# Patient Record
Sex: Male | Born: 1950 | Race: Black or African American | Hispanic: No | Marital: Single | Smoking: Current every day smoker
Health system: Southern US, Community
[De-identification: ages and names within clinical notes are randomized; demographics above are authoritative.]

## PROBLEM LIST (undated history)

## (undated) DIAGNOSIS — M549 Dorsalgia, unspecified: Secondary | ICD-10-CM

## (undated) DIAGNOSIS — J449 Chronic obstructive pulmonary disease, unspecified: Secondary | ICD-10-CM

## (undated) HISTORY — PX: EYE SURGERY: SHX253

## (undated) HISTORY — PX: OTHER SURGICAL HISTORY: SHX169

---

## 2014-12-27 ENCOUNTER — Emergency Department (HOSPITAL_COMMUNITY)
Admission: EM | Admit: 2014-12-27 | Discharge: 2014-12-27 | Disposition: A | Discharge status: Home / Self Care | Payer: Self-pay | Attending: Emergency Medicine | Admitting: Emergency Medicine

## 2014-12-27 ENCOUNTER — Encounter (HOSPITAL_COMMUNITY): Payer: Self-pay | Admitting: Emergency Medicine

## 2014-12-27 DIAGNOSIS — F17210 Nicotine dependence, cigarettes, uncomplicated: Secondary | ICD-10-CM | POA: Insufficient documentation

## 2014-12-27 DIAGNOSIS — Y999 Unspecified external cause status: Secondary | ICD-10-CM | POA: Insufficient documentation

## 2014-12-27 DIAGNOSIS — X58XXXA Exposure to other specified factors, initial encounter: Secondary | ICD-10-CM | POA: Insufficient documentation

## 2014-12-27 DIAGNOSIS — S39012A Strain of muscle, fascia and tendon of lower back, initial encounter: Secondary | ICD-10-CM | POA: Insufficient documentation

## 2014-12-27 DIAGNOSIS — Y939 Activity, unspecified: Secondary | ICD-10-CM | POA: Insufficient documentation

## 2014-12-27 DIAGNOSIS — G8929 Other chronic pain: Secondary | ICD-10-CM | POA: Insufficient documentation

## 2014-12-27 DIAGNOSIS — Z880 Allergy status to penicillin: Secondary | ICD-10-CM | POA: Insufficient documentation

## 2014-12-27 DIAGNOSIS — Y929 Unspecified place or not applicable: Secondary | ICD-10-CM | POA: Insufficient documentation

## 2014-12-27 HISTORY — DX: Dorsalgia, unspecified: M54.9

## 2014-12-27 LAB — URINALYSIS, ROUTINE W REFLEX MICROSCOPIC
Glucose, UA: NEGATIVE mg/dL
Hgb urine dipstick: NEGATIVE
Ketones, ur: 40 mg/dL — AB
Leukocytes, UA: NEGATIVE
Nitrite: NEGATIVE
Protein, ur: 30 mg/dL — AB
Specific Gravity, Urine: >1.03 — ABNORMAL HIGH (ref 1.005–1.030)
pH: 6 (ref 5.0–8.0)

## 2014-12-27 LAB — URINE MICROSCOPIC-ADD ON

## 2014-12-27 MED ORDER — NAPROXEN 500 MG PO TABS: 500.0000 mg | ORAL_TABLET | Freq: Two times a day (BID) | ORAL | Status: DC

## 2014-12-27 MED ORDER — METHOCARBAMOL 500 MG PO TABS: 500.0000 mg | ORAL_TABLET | Freq: Three times a day (TID) | ORAL | Status: DC

## 2014-12-27 NOTE — ED Notes (Signed)
Discharge papers reviewed with pt- Discussed use of muscle relaxer and  Pain meds-- also concerning not using other anti-inflammatory pain meds with the prescription med. Pt verbalized understanding .

## 2014-12-27 NOTE — Discharge Instructions (Signed)

## 2014-12-27 NOTE — ED Notes (Signed)
PT c/o lower back pain worsening x1 day with no reported injury. PT c/o hx of chronic lower back pain. PT ambulatory in triage with NAD noted.

## 2014-12-29 LAB — URINE CULTURE: Culture: 2000
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
## 2017-07-28 DIAGNOSIS — R221 Localized swelling, mass and lump, neck: Secondary | ICD-10-CM | POA: Diagnosis not present

## 2017-08-25 NOTE — Congregational Nurse Program (Signed)
Congregational Nurse Program Note  Date of Encounter: 08/25/2017  Past Medical History: Past Medical History:  Diagnosis Date  . Back pain     Encounter Details: Seen at the food [facility]. No complaints or concerns B P 104/70  [address] Jaeleen Inzunza [place], [place] [facility]  Program [phone]

## 2019-03-19 ENCOUNTER — Ambulatory Visit: Payer: Medicare Other

## 2019-03-27 DIAGNOSIS — R0602 Shortness of breath: Secondary | ICD-10-CM | POA: Diagnosis not present

## 2019-03-27 DIAGNOSIS — J309 Allergic rhinitis, unspecified: Secondary | ICD-10-CM | POA: Diagnosis not present

## 2019-03-27 DIAGNOSIS — Z1159 Encounter for screening for other viral diseases: Secondary | ICD-10-CM | POA: Diagnosis not present

## 2019-05-08 DIAGNOSIS — J449 Chronic obstructive pulmonary disease, unspecified: Secondary | ICD-10-CM | POA: Diagnosis not present

## 2019-05-08 DIAGNOSIS — Z0000 Encounter for general adult medical examination without abnormal findings: Secondary | ICD-10-CM | POA: Diagnosis not present

## 2019-05-08 DIAGNOSIS — R221 Localized swelling, mass and lump, neck: Secondary | ICD-10-CM | POA: Diagnosis not present

## 2019-05-29 ENCOUNTER — Encounter: Payer: Self-pay | Admitting: *Deleted

## 2019-06-24 ENCOUNTER — Emergency Department (HOSPITAL_COMMUNITY)
Admission: EM | Admit: 2019-06-24 | Discharge: 2019-06-24 | Disposition: A | Discharge status: Home / Self Care | Payer: Medicare Other | Attending: Emergency Medicine | Admitting: Emergency Medicine

## 2019-06-24 ENCOUNTER — Other Ambulatory Visit: Payer: Self-pay

## 2019-06-24 ENCOUNTER — Emergency Department (HOSPITAL_COMMUNITY): Payer: Medicare Other

## 2019-06-24 ENCOUNTER — Encounter (HOSPITAL_COMMUNITY): Payer: Self-pay | Admitting: Emergency Medicine

## 2019-06-24 DIAGNOSIS — R0602 Shortness of breath: Secondary | ICD-10-CM | POA: Diagnosis present

## 2019-06-24 DIAGNOSIS — J441 Chronic obstructive pulmonary disease with (acute) exacerbation: Secondary | ICD-10-CM | POA: Insufficient documentation

## 2019-06-24 DIAGNOSIS — Z7951 Long term (current) use of inhaled steroids: Secondary | ICD-10-CM | POA: Diagnosis not present

## 2019-06-24 DIAGNOSIS — Z79899 Other long term (current) drug therapy: Secondary | ICD-10-CM | POA: Diagnosis not present

## 2019-06-24 DIAGNOSIS — F17210 Nicotine dependence, cigarettes, uncomplicated: Secondary | ICD-10-CM | POA: Diagnosis not present

## 2019-06-24 LAB — COMPREHENSIVE METABOLIC PANEL
ALT: 15 U/L (ref 0–44)
AST: 17 U/L (ref 15–41)
Albumin: 3.7 g/dL (ref 3.5–5.0)
Alkaline Phosphatase: 73 U/L (ref 38–126)
Anion gap: 7 (ref 5–15)
BUN: 14 mg/dL (ref 8–23)
CO2: 28 mmol/L (ref 22–32)
Calcium: 8.9 mg/dL (ref 8.9–10.3)
Chloride: 107 mmol/L (ref 98–111)
Creatinine, Ser: 1.17 mg/dL (ref 0.61–1.24)
GFR calc Af Amer: >60 mL/min (ref >60)
GFR calc non Af Amer: >60 mL/min (ref >60)
Glucose, Bld: 82 mg/dL (ref 70–99)
Potassium: 3.8 mmol/L (ref 3.5–5.1)
Sodium: 142 mmol/L (ref 135–145)
Total Bilirubin: 0.5 mg/dL (ref 0.3–1.2)
Total Protein: 6.6 g/dL (ref 6.5–8.1)

## 2019-06-24 LAB — CBC
HCT: 39.6 % (ref 39.0–52.0)
Hemoglobin: 12.6 g/dL — ABNORMAL LOW (ref 13.0–17.0)
MCH: 26.7 pg (ref 26.0–34.0)
MCHC: 31.8 g/dL (ref 30.0–36.0)
MCV: 83.9 fL (ref 80.0–100.0)
Platelets: 119 10*3/uL — ABNORMAL LOW (ref 150–400)
RBC: 4.72 MIL/uL (ref 4.22–5.81)
RDW: 15.3 % (ref 11.5–15.5)
WBC: 4 10*3/uL (ref 4.0–10.5)
nRBC: 0 % (ref 0.0–0.2)

## 2019-06-24 MED ORDER — PREDNISONE 10 MG PO TABS: 40.0000 mg | ORAL_TABLET | Freq: Every day | ORAL | 0 refills | Status: DC

## 2019-06-24 MED ORDER — PREDNISONE 50 MG PO TABS
60.0000 mg | ORAL_TABLET | Freq: Once | ORAL | Status: AC
  Administered 2019-06-24: 60 mg via ORAL
  Filled 2019-06-24: qty 1

## 2019-06-24 MED ORDER — ALBUTEROL SULFATE HFA 108 (90 BASE) MCG/ACT IN AERS
2.0000 | INHALATION_SPRAY | Freq: Four times a day (QID) | RESPIRATORY_TRACT | Status: DC
  Administered 2019-06-24: 2 via RESPIRATORY_TRACT
  Filled 2019-06-24: qty 6.7

## 2019-06-24 NOTE — ED Triage Notes (Signed)
Pt c/o of increased SOb. Used inhalers at home but no relief. HX of COPD

## 2019-06-24 NOTE — Discharge Instructions (Signed)
Use albuterol inhaler 2 puffs every 6 hours.  Take the prednisone as directed for the next 5 days.  Return for any new or worse symptoms.  Keep your appointment for evaluation of the lump in the neck.
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
## 2019-06-24 NOTE — ED Notes (Signed)
Pt refused vitals at time of discharge

## 2019-07-04 ENCOUNTER — Other Ambulatory Visit: Payer: Self-pay | Admitting: Internal Medicine

## 2019-07-04 ENCOUNTER — Other Ambulatory Visit (HOSPITAL_COMMUNITY): Payer: Self-pay | Admitting: Internal Medicine

## 2019-07-04 DIAGNOSIS — R221 Localized swelling, mass and lump, neck: Secondary | ICD-10-CM

## 2019-08-06 ENCOUNTER — Other Ambulatory Visit: Payer: Self-pay

## 2019-08-06 ENCOUNTER — Other Ambulatory Visit (HOSPITAL_COMMUNITY): Payer: Self-pay | Admitting: Family Medicine

## 2019-08-06 ENCOUNTER — Ambulatory Visit (HOSPITAL_COMMUNITY)
Admission: RE | Admit: 2019-08-06 | Discharge: 2019-08-06 | Disposition: A | Discharge status: Home / Self Care | Payer: Medicare Other | Source: Ambulatory Visit | Attending: Family Medicine | Admitting: Family Medicine

## 2019-08-06 DIAGNOSIS — J441 Chronic obstructive pulmonary disease with (acute) exacerbation: Secondary | ICD-10-CM | POA: Diagnosis not present

## 2019-08-22 ENCOUNTER — Ambulatory Visit: Payer: Medicare Other
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
## 2019-08-23 ENCOUNTER — Ambulatory Visit: Payer: Medicare Other | Admitting: Urology

## 2020-03-26 ENCOUNTER — Emergency Department (HOSPITAL_COMMUNITY)
Admission: EM | Admit: 2020-03-26 | Discharge: 2020-03-26 | Disposition: A | Discharge status: Home / Self Care | Payer: Medicare Other | Attending: Emergency Medicine | Admitting: Emergency Medicine

## 2020-03-26 ENCOUNTER — Encounter (HOSPITAL_COMMUNITY): Payer: Self-pay | Admitting: *Deleted

## 2020-03-26 ENCOUNTER — Emergency Department (HOSPITAL_COMMUNITY): Payer: Medicare Other

## 2020-03-26 ENCOUNTER — Other Ambulatory Visit: Payer: Self-pay

## 2020-03-26 DIAGNOSIS — Z7951 Long term (current) use of inhaled steroids: Secondary | ICD-10-CM | POA: Insufficient documentation

## 2020-03-26 DIAGNOSIS — J441 Chronic obstructive pulmonary disease with (acute) exacerbation: Secondary | ICD-10-CM | POA: Diagnosis not present

## 2020-03-26 DIAGNOSIS — R0602 Shortness of breath: Secondary | ICD-10-CM | POA: Diagnosis present

## 2020-03-26 DIAGNOSIS — F17210 Nicotine dependence, cigarettes, uncomplicated: Secondary | ICD-10-CM | POA: Insufficient documentation

## 2020-03-26 HISTORY — DX: Chronic obstructive pulmonary disease, unspecified: J44.9

## 2020-03-26 LAB — CBC WITH DIFFERENTIAL/PLATELET
Abs Immature Granulocytes: 0 10*3/uL (ref 0.00–0.07)
Basophils Absolute: 0 10*3/uL (ref 0.0–0.1)
Basophils Relative: 1 %
Eosinophils Absolute: 0.5 10*3/uL (ref 0.0–0.5)
Eosinophils Relative: 12 %
HCT: 41.9 % (ref 39.0–52.0)
Hemoglobin: 13.3 g/dL (ref 13.0–17.0)
Immature Granulocytes: 0 %
Lymphocytes Relative: 18 %
Lymphs Abs: 0.8 10*3/uL (ref 0.7–4.0)
MCH: 27.1 pg (ref 26.0–34.0)
MCHC: 31.7 g/dL (ref 30.0–36.0)
MCV: 85.3 fL (ref 80.0–100.0)
Monocytes Absolute: 0.5 10*3/uL (ref 0.1–1.0)
Monocytes Relative: 12 %
Neutro Abs: 2.5 10*3/uL (ref 1.7–7.7)
Neutrophils Relative %: 57 %
Platelets: 124 10*3/uL — ABNORMAL LOW (ref 150–400)
RBC: 4.91 MIL/uL (ref 4.22–5.81)
RDW: 15.4 % (ref 11.5–15.5)
WBC: 4.3 10*3/uL (ref 4.0–10.5)
nRBC: 0 % (ref 0.0–0.2)

## 2020-03-26 LAB — BASIC METABOLIC PANEL
Anion gap: 9 (ref 5–15)
BUN: 14 mg/dL (ref 8–23)
CO2: 25 mmol/L (ref 22–32)
Calcium: 8.6 mg/dL — ABNORMAL LOW (ref 8.9–10.3)
Chloride: 105 mmol/L (ref 98–111)
Creatinine, Ser: 1.08 mg/dL (ref 0.61–1.24)
GFR, Estimated: >60 mL/min (ref >60)
Glucose, Bld: 76 mg/dL (ref 70–99)
Potassium: 3.9 mmol/L (ref 3.5–5.1)
Sodium: 139 mmol/L (ref 135–145)

## 2020-03-26 MED ORDER — METHYLPREDNISOLONE SODIUM SUCC 125 MG IJ SOLR
125.0000 mg | Freq: Once | INTRAMUSCULAR | Status: AC
  Administered 2020-03-26: 125 mg via INTRAVENOUS
  Filled 2020-03-26: qty 2

## 2020-03-26 MED ORDER — PREDNISONE 10 MG PO TABS: 40.0000 mg | ORAL_TABLET | Freq: Every day | ORAL | 0 refills | Status: DC

## 2020-03-26 MED ORDER — ALBUTEROL SULFATE HFA 108 (90 BASE) MCG/ACT IN AERS
2.0000 | INHALATION_SPRAY | Freq: Once | RESPIRATORY_TRACT | Status: AC
  Administered 2020-03-26: 2 via RESPIRATORY_TRACT
  Filled 2020-03-26: qty 6.7

## 2020-03-26 NOTE — ED Triage Notes (Signed)
Pt c/o SOB and productive cough worsening over the last week. Denies fever. Pt reports hx of COPD.

## 2020-03-26 NOTE — Discharge Instructions (Signed)
Use albuterol inhaler 2 puffs every 6 hours.  Take the prednisone as directed.  Return for any new or worse symptoms.
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
## 2021-03-17 ENCOUNTER — Other Ambulatory Visit: Payer: Self-pay | Admitting: Nurse Practitioner

## 2021-03-17 DIAGNOSIS — R221 Localized swelling, mass and lump, neck: Secondary | ICD-10-CM

## 2021-04-02 ENCOUNTER — Encounter (HOSPITAL_COMMUNITY): Payer: Self-pay

## 2021-04-02 ENCOUNTER — Ambulatory Visit (HOSPITAL_COMMUNITY): Admission: RE | Admit: 2021-04-02 | Payer: Medicare Other | Source: Ambulatory Visit

## 2021-05-28 ENCOUNTER — Other Ambulatory Visit: Payer: Self-pay | Admitting: Family Medicine

## 2021-05-28 DIAGNOSIS — R221 Localized swelling, mass and lump, neck: Secondary | ICD-10-CM

## 2021-05-31 ENCOUNTER — Other Ambulatory Visit: Payer: Medicaid Other

## 2022-03-21 ENCOUNTER — Other Ambulatory Visit: Payer: Self-pay

## 2022-03-21 ENCOUNTER — Emergency Department (HOSPITAL_COMMUNITY): Payer: Medicare HMO

## 2022-03-21 ENCOUNTER — Emergency Department (HOSPITAL_COMMUNITY)
Admission: EM | Admit: 2022-03-21 | Discharge: 2022-03-21 | Disposition: A | Discharge status: Home / Self Care | Payer: Medicare HMO | Attending: Emergency Medicine | Admitting: Emergency Medicine

## 2022-03-21 DIAGNOSIS — Z87891 Personal history of nicotine dependence: Secondary | ICD-10-CM | POA: Diagnosis not present

## 2022-03-21 DIAGNOSIS — R739 Hyperglycemia, unspecified: Secondary | ICD-10-CM | POA: Insufficient documentation

## 2022-03-21 DIAGNOSIS — R300 Dysuria: Secondary | ICD-10-CM | POA: Diagnosis present

## 2022-03-21 DIAGNOSIS — E876 Hypokalemia: Secondary | ICD-10-CM | POA: Insufficient documentation

## 2022-03-21 DIAGNOSIS — J449 Chronic obstructive pulmonary disease, unspecified: Secondary | ICD-10-CM | POA: Diagnosis not present

## 2022-03-21 DIAGNOSIS — N390 Urinary tract infection, site not specified: Secondary | ICD-10-CM | POA: Diagnosis not present

## 2022-03-21 DIAGNOSIS — Z7951 Long term (current) use of inhaled steroids: Secondary | ICD-10-CM | POA: Insufficient documentation

## 2022-03-21 DIAGNOSIS — Z1152 Encounter for screening for COVID-19: Secondary | ICD-10-CM | POA: Diagnosis not present

## 2022-03-21 DIAGNOSIS — R509 Fever, unspecified: Secondary | ICD-10-CM

## 2022-03-21 DIAGNOSIS — D696 Thrombocytopenia, unspecified: Secondary | ICD-10-CM | POA: Diagnosis not present

## 2022-03-21 DIAGNOSIS — R000 Tachycardia, unspecified: Secondary | ICD-10-CM | POA: Insufficient documentation

## 2022-03-21 DIAGNOSIS — E871 Hypo-osmolality and hyponatremia: Secondary | ICD-10-CM | POA: Diagnosis not present

## 2022-03-21 LAB — RESP PANEL BY RT-PCR (RSV, FLU A&B, COVID)  RVPGX2
Influenza A by PCR: NEGATIVE
Influenza B by PCR: NEGATIVE
Resp Syncytial Virus by PCR: NEGATIVE
SARS Coronavirus 2 by RT PCR: NEGATIVE

## 2022-03-21 LAB — CBC WITH DIFFERENTIAL/PLATELET
Abs Immature Granulocytes: 0.03 10*3/uL (ref 0.00–0.07)
Basophils Absolute: 0 10*3/uL (ref 0.0–0.1)
Basophils Relative: 0 %
Eosinophils Absolute: 0 10*3/uL (ref 0.0–0.5)
Eosinophils Relative: 0 %
HCT: 39.3 % (ref 39.0–52.0)
Hemoglobin: 12.7 g/dL — ABNORMAL LOW (ref 13.0–17.0)
Immature Granulocytes: 1 %
Lymphocytes Relative: 5 %
Lymphs Abs: 0.3 10*3/uL — ABNORMAL LOW (ref 0.7–4.0)
MCH: 26.7 pg (ref 26.0–34.0)
MCHC: 32.3 g/dL (ref 30.0–36.0)
MCV: 82.6 fL (ref 80.0–100.0)
Monocytes Absolute: 0.6 10*3/uL (ref 0.1–1.0)
Monocytes Relative: 10 %
Neutro Abs: 5.4 10*3/uL (ref 1.7–7.7)
Neutrophils Relative %: 84 %
Platelets: 107 10*3/uL — ABNORMAL LOW (ref 150–400)
RBC: 4.76 MIL/uL (ref 4.22–5.81)
RDW: 15.3 % (ref 11.5–15.5)
WBC: 6.4 10*3/uL (ref 4.0–10.5)
nRBC: 0 % (ref 0.0–0.2)

## 2022-03-21 LAB — COMPREHENSIVE METABOLIC PANEL
ALT: 25 U/L (ref 0–44)
AST: 36 U/L (ref 15–41)
Albumin: 3.2 g/dL — ABNORMAL LOW (ref 3.5–5.0)
Alkaline Phosphatase: 89 U/L (ref 38–126)
Anion gap: 7 (ref 5–15)
BUN: 19 mg/dL (ref 8–23)
CO2: 23 mmol/L (ref 22–32)
Calcium: 8.1 mg/dL — ABNORMAL LOW (ref 8.9–10.3)
Chloride: 103 mmol/L (ref 98–111)
Creatinine, Ser: 1.2 mg/dL (ref 0.61–1.24)
GFR, Estimated: >60 mL/min (ref >60)
Glucose, Bld: 116 mg/dL — ABNORMAL HIGH (ref 70–99)
Potassium: 3.3 mmol/L — ABNORMAL LOW (ref 3.5–5.1)
Sodium: 133 mmol/L — ABNORMAL LOW (ref 135–145)
Total Bilirubin: 0.5 mg/dL (ref 0.3–1.2)
Total Protein: 7.2 g/dL (ref 6.5–8.1)

## 2022-03-21 LAB — URINALYSIS, ROUTINE W REFLEX MICROSCOPIC
Bilirubin Urine: NEGATIVE
Glucose, UA: NEGATIVE mg/dL
Ketones, ur: 20 mg/dL — AB
Nitrite: NEGATIVE
Protein, ur: 100 mg/dL — AB
Specific Gravity, Urine: 1.027 (ref 1.005–1.030)
WBC, UA: >50 WBC/hpf (ref 0–5)
pH: 5 (ref 5.0–8.0)

## 2022-03-21 LAB — LACTIC ACID, PLASMA: Lactic Acid, Venous: 1.1 mmol/L (ref 0.5–1.9)

## 2022-03-21 LAB — PROTIME-INR
INR: 1 (ref 0.8–1.2)
Prothrombin Time: 13.4 seconds (ref 11.4–15.2)

## 2022-03-21 MED ORDER — SODIUM CHLORIDE 0.9 % IV BOLUS
1000.0000 mL | Freq: Once | INTRAVENOUS | Status: AC
  Administered 2022-03-21: 1000 mL via INTRAVENOUS

## 2022-03-21 MED ORDER — IBUPROFEN 400 MG PO TABS
600.0000 mg | ORAL_TABLET | Freq: Once | ORAL | Status: AC
  Administered 2022-03-21: 600 mg via ORAL
  Filled 2022-03-21: qty 2

## 2022-03-21 MED ORDER — ACETAMINOPHEN 325 MG PO TABS
650.0000 mg | ORAL_TABLET | Freq: Once | ORAL | Status: AC
  Administered 2022-03-21: 650 mg via ORAL
  Filled 2022-03-21: qty 2

## 2022-03-21 MED ORDER — CEPHALEXIN 500 MG PO CAPS: 500.0000 mg | ORAL_CAPSULE | Freq: Four times a day (QID) | ORAL | 0 refills | Status: DC

## 2022-03-21 MED ORDER — SODIUM CHLORIDE 0.9 % IV SOLN
1.0000 g | Freq: Once | INTRAVENOUS | Status: AC
  Administered 2022-03-21: 1 g via INTRAVENOUS
  Filled 2022-03-21: qty 10

## 2022-03-21 NOTE — Discharge Instructions (Signed)
You are seen in the emergency department for dizziness and urinary symptoms.  Your urine was positive for signs of infection.  Please use Tylenol and ibuprofen as needed for fever and pain.  Finish your antibiotics.  Follow-up with your primary care doctor.  Return to the emergency department if any worsening or concerning symptoms

## 2022-03-21 NOTE — ED Triage Notes (Signed)
Pt c/o dizziness and dysuria x3 days.

## 2022-03-21 NOTE — ED Provider Notes (Signed)
[facility] Provider Note   CSN: [id number] Arrival date & time: 03/21/22  1856     History {Add pertinent medical, surgical, social history, OB history to HPI:1} Chief Complaint  Patient presents with   Fever   Dysuria    Jonathan Atkinson is a 72 y.o. male.  He is a former smoker, history of COPD uses an inhaler.  He said for the last 2 or 3 days he is felt lightheaded especially when standing for very long.  He is also noticed some burning with urination and he feels like he is urinating less than normal.  He denies any sores or lesions, not sexually active.  He thinks he might have been running a fever although he does not check his temperature.  He denies any cough nausea vomiting diarrhea rashes headache sore throat.  He has tried nothing for his symptoms.   Dysuria Presenting symptoms: dysuria   Context: after urination   Relieved by:  None tried Worsened by:  Urination Ineffective treatments:  None tried Associated symptoms: fever and urinary hesitation   Associated symptoms: no abdominal pain, no genital lesions, no hematuria, no nausea and no vomiting   Risk factors: not currently sexually active        Home Medications Prior to Admission medications   Medication Sig Start Date End Date Taking? Authorizing Provider  albuterol (VENTOLIN HFA) 108 (90 Base) MCG/ACT inhaler Inhale 2 puffs into the lungs every 4 (four) hours as needed. 06/14/19   [provider]  fluticasone (FLONASE) 50 MCG/ACT nasal spray Place 1 spray into both nostrils daily. 05/24/19   [provider]  loratadine (CLARITIN) 10 MG tablet Take 10 mg by mouth daily. 05/24/19   [provider]  methocarbamol (ROBAXIN) 500 MG tablet Take 1 tablet (500 mg total) by mouth 3 (three) times daily. Patient not taking: Reported on 06/24/2019 12/27/14   Triplett, Tammy, PA-C  naproxen (NAPROSYN) 500 MG tablet Take 1 tablet (500 mg total) by mouth 2  (two) times daily with a meal. Patient not taking: Reported on 06/24/2019 12/27/14   Triplett, Tammy, PA-C  predniSONE (DELTASONE) 10 MG tablet Take 4 tablets (40 mg total) by mouth daily. 06/24/19   Fredia Sorrow, MD  predniSONE (DELTASONE) 10 MG tablet Take 4 tablets (40 mg total) by mouth daily. 03/26/20   Fredia Sorrow, MD  STIOLTO RESPIMAT 2.5-2.5 MCG/ACT AERS Inhale 1 puff into the lungs daily. 06/14/19   [provider]      Allergies    Penicillins    Review of Systems   Review of Systems  Constitutional:  Positive for fever.  HENT:  Negative for sore throat.   Respiratory:  Negative for shortness of breath.   Cardiovascular:  Negative for chest pain.  Gastrointestinal:  Negative for abdominal pain, nausea and vomiting.  Genitourinary:  Positive for dysuria and hesitancy. Negative for hematuria.  Musculoskeletal:  Negative for back pain.  Skin:  Negative for rash.  Neurological:  Positive for dizziness.    Physical Exam Updated Vital Signs BP 132/79   Pulse (!) 117   Temp (!) 103.1 F (39.5 C) (Oral)   Resp (!) 22   Wt 69.4 kg   SpO2 97%   BMI 21.34 kg/m  Physical Exam Vitals and nursing note reviewed.  Constitutional:      General: He is not in acute distress.    Appearance: Normal appearance. He is well-developed.  HENT:  Head: Normocephalic and atraumatic.  Eyes:     Conjunctiva/sclera: Conjunctivae normal.  Cardiovascular:     Rate and Rhythm: Regular rhythm. Tachycardia present.     Heart sounds: No murmur heard. Pulmonary:     Effort: Pulmonary effort is normal. No respiratory distress.     Breath sounds: Normal breath sounds.  Abdominal:     Palpations: Abdomen is soft.     Tenderness: There is no abdominal tenderness. There is no guarding or rebound.  Musculoskeletal:        General: No deformity.     Cervical back: Neck supple.     Right lower leg: No edema.     Left lower leg: No edema.  Skin:    General: Skin is warm and dry.      Capillary Refill: Capillary refill takes less than 2 seconds.  Neurological:     General: No focal deficit present.     Mental Status: He is alert.     ED Results / Procedures / Treatments   Labs (all labs ordered are listed, but only abnormal results are displayed) Labs Reviewed  CULTURE, BLOOD (ROUTINE X 2)  CULTURE, BLOOD (ROUTINE X 2)  RESP PANEL BY RT-PCR (RSV, FLU A&B, COVID)  [id number]  COMPREHENSIVE METABOLIC PANEL  LACTIC ACID, PLASMA  LACTIC ACID, PLASMA  CBC WITH DIFFERENTIAL/PLATELET  PROTIME-INR  URINALYSIS, ROUTINE W REFLEX MICROSCOPIC    EKG None  Radiology No results found.  Procedures Procedures  {Document cardiac monitor, telemetry assessment procedure when appropriate:1}  Medications Ordered in ED Medications  sodium chloride 0.9 % bolus 1,000 mL (has no administration in time range)  acetaminophen (TYLENOL) tablet 650 mg (has no administration in time range)    ED Course/ Medical Decision Making/ A&P Clinical Course as of 03/21/22 2001  Mon Mar 21, 2022  2000 Chest x-ray interpreted by me as no acute infiltrate.  Awaiting radiology reading. [MB]    Clinical Course User Index [MB] Hayden Rasmussen, MD   {   Click here for ABCD2, HEART and other calculatorsREFRESH Note before signing :1}                          Medical Decision Making Amount and/or Complexity of Data Reviewed Labs: ordered.  Risk OTC drugs.   This patient complains of ***; this involves an extensive number of treatment Options and is a complaint that carries with it a high risk of complications and morbidity. The differential includes ***  I ordered, reviewed and interpreted labs, which included *** I ordered medication *** and reviewed PMP when indicated. I ordered imaging studies which included *** and I independently    visualized and interpreted imaging which showed *** Additional history obtained from *** Previous records obtained and reviewed *** I consulted  *** and discussed lab and imaging findings and discussed disposition.  Cardiac monitoring reviewed, *** Social determinants considered, *** Critical Interventions: ***  After the interventions stated above, I reevaluated the patient and found *** Admission and further testing considered, ***   {Document critical care time when appropriate:1} {Document review of labs and clinical decision tools ie heart score, Chads2Vasc2 etc:1}  {Document your independent review of radiology images, and any outside records:1} {Document your discussion with family members, caretakers, and with consultants:1} {Document social determinants of health affecting pt's care:1} {Document your decision making why or why not admission, treatments were needed:1} Final Clinical Impression(s) / ED Diagnoses Final diagnoses:  None  Rx / DC Orders ED Discharge Orders     None

## 2022-03-21 NOTE — ED Notes (Signed)
ED Provider at bedside. 

## 2022-03-24 LAB — URINE CULTURE: Culture: 80000 — AB

## 2022-03-25 ENCOUNTER — Telehealth (HOSPITAL_BASED_OUTPATIENT_CLINIC_OR_DEPARTMENT_OTHER): Payer: Self-pay | Admitting: *Deleted

## 2022-03-25 NOTE — Telephone Encounter (Signed)
Post ED Visit - Positive Culture Follow-up  Culture report reviewed by antimicrobial stewardship pharmacist: [place] Team '[]'$  Elenor Quinones, Pharm.D. '[]'$  Heide Guile, Pharm.D., BCPS AQ-ID '[]'$  Parks Neptune, Pharm.D., BCPS '[]'$  Alycia Rossetti, Pharm.D., BCPS '[]'$  [place], Pharm.D., BCPS, AAHIVP '[]'$  Legrand Como, Pharm.D., BCPS, AAHIVP '[]'$  Salome Arnt, PharmD, BCPS '[]'$  Johnnette Gourd, PharmD, BCPS '[]'$  Hughes Better, PharmD, BCPS '[]'$  Leeroy Cha, PharmD '[]'$  Laqueta Linden, PharmD, BCPS '[]'$  Albertina Parr, PharmD  [place] Team '[]'$  Leodis Sias, PharmD '[]'$  Lindell Spar, PharmD '[]'$  Royetta Asal, PharmD '[]'$  Graylin Shiver, Rph '[]'$  Rema Fendt) Glennon Mac, PharmD '[]'$  Arlyn Dunning, PharmD '[]'$  Netta Cedars, PharmD '[]'$  Dia Sitter, PharmD '[]'$  Leone Haven, PharmD '[]'$  Gretta Arab, PharmD '[]'$  Theodis Shove, PharmD '[]'$  Peggyann Juba, PharmD '[]'$  Reuel Boom, PharmD   Positive urine culture Treated with Cephalexin, organism sensitive to the same and no further patient follow-up is required at this time.  Luisa Hart, PharmD  Harlon Flor Talley 03/25/2022, 11:15 AM

## 2022-03-26 LAB — CULTURE, BLOOD (ROUTINE X 2)
Culture: NO GROWTH
Culture: NO GROWTH

## 2022-08-07 IMAGING — DX DG CHEST 1V PORT
2 series · 2 of 2 positions shown · non-contrast
Comparison: 08/06/2019 chest radiograph.

CLINICAL DATA: Dyspnea and cough for 2 days

EXAM:
PORTABLE CHEST 1 VIEW

[chest ap (1 of 2)]
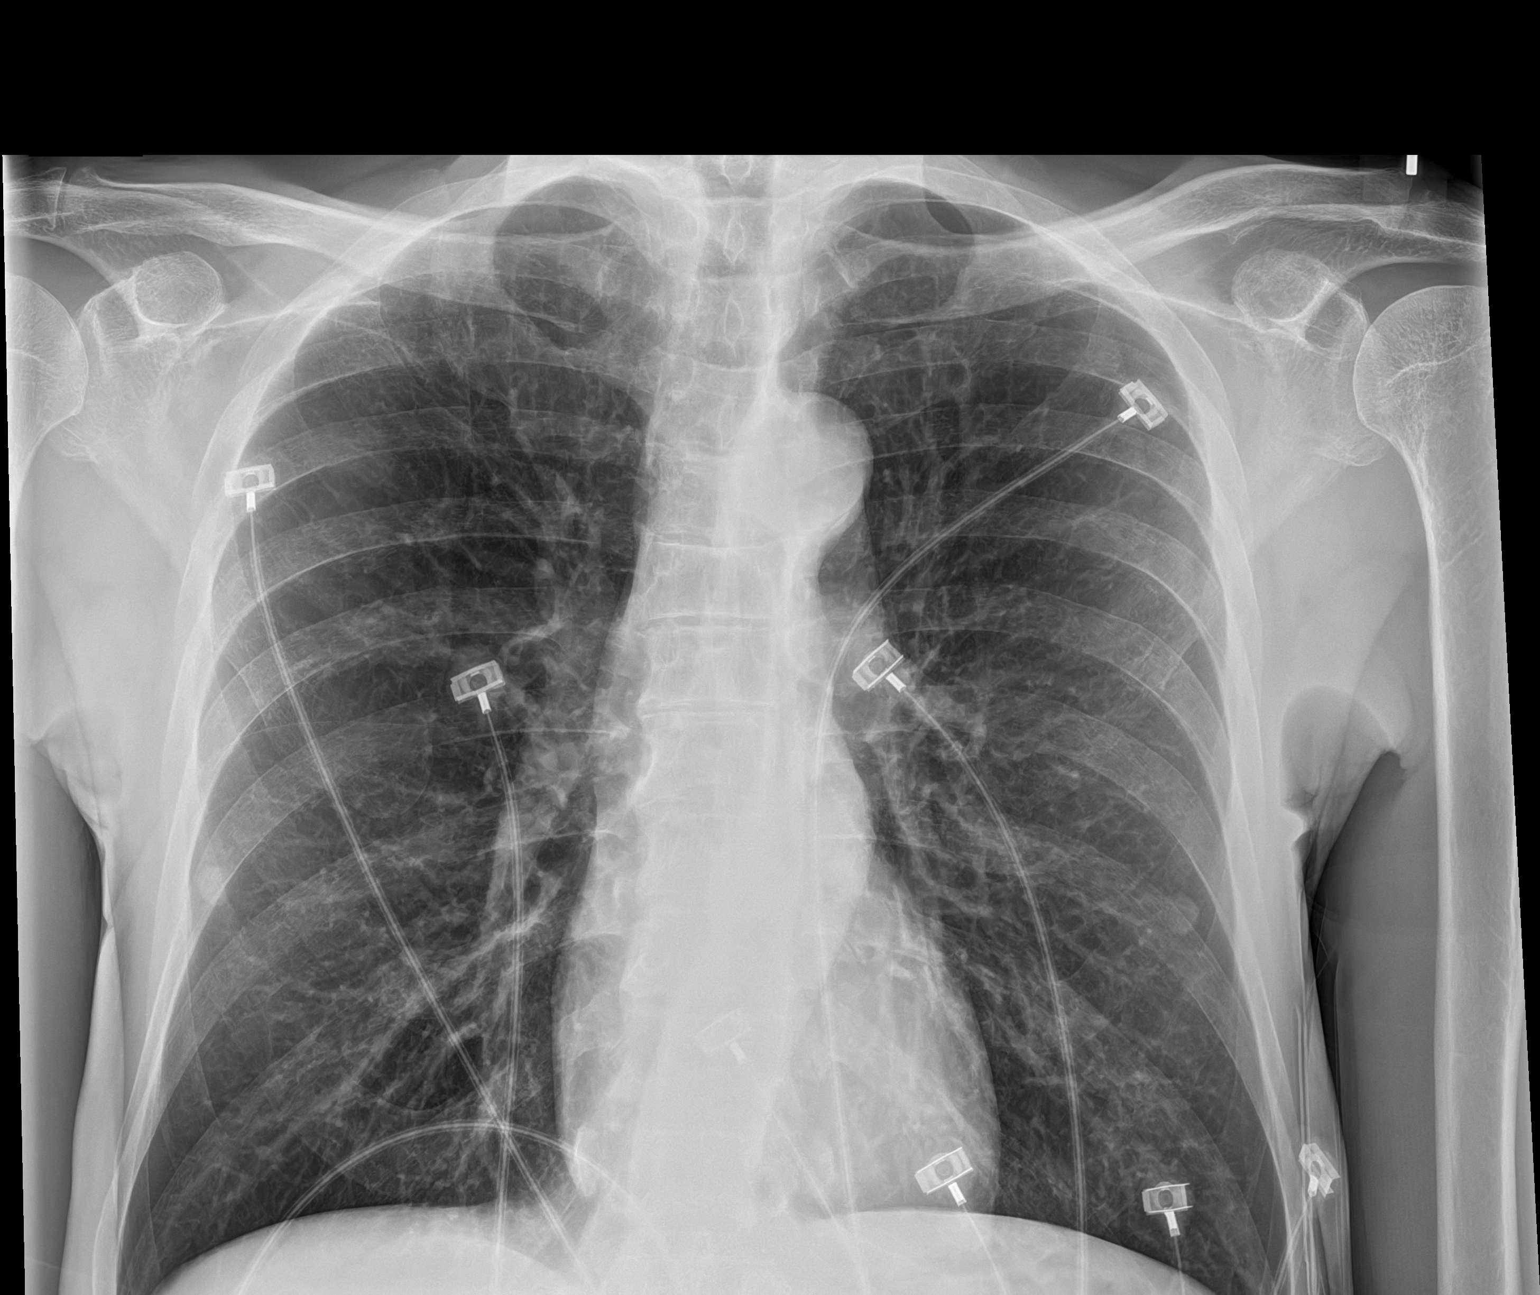

[chest ap (2 of 2)]
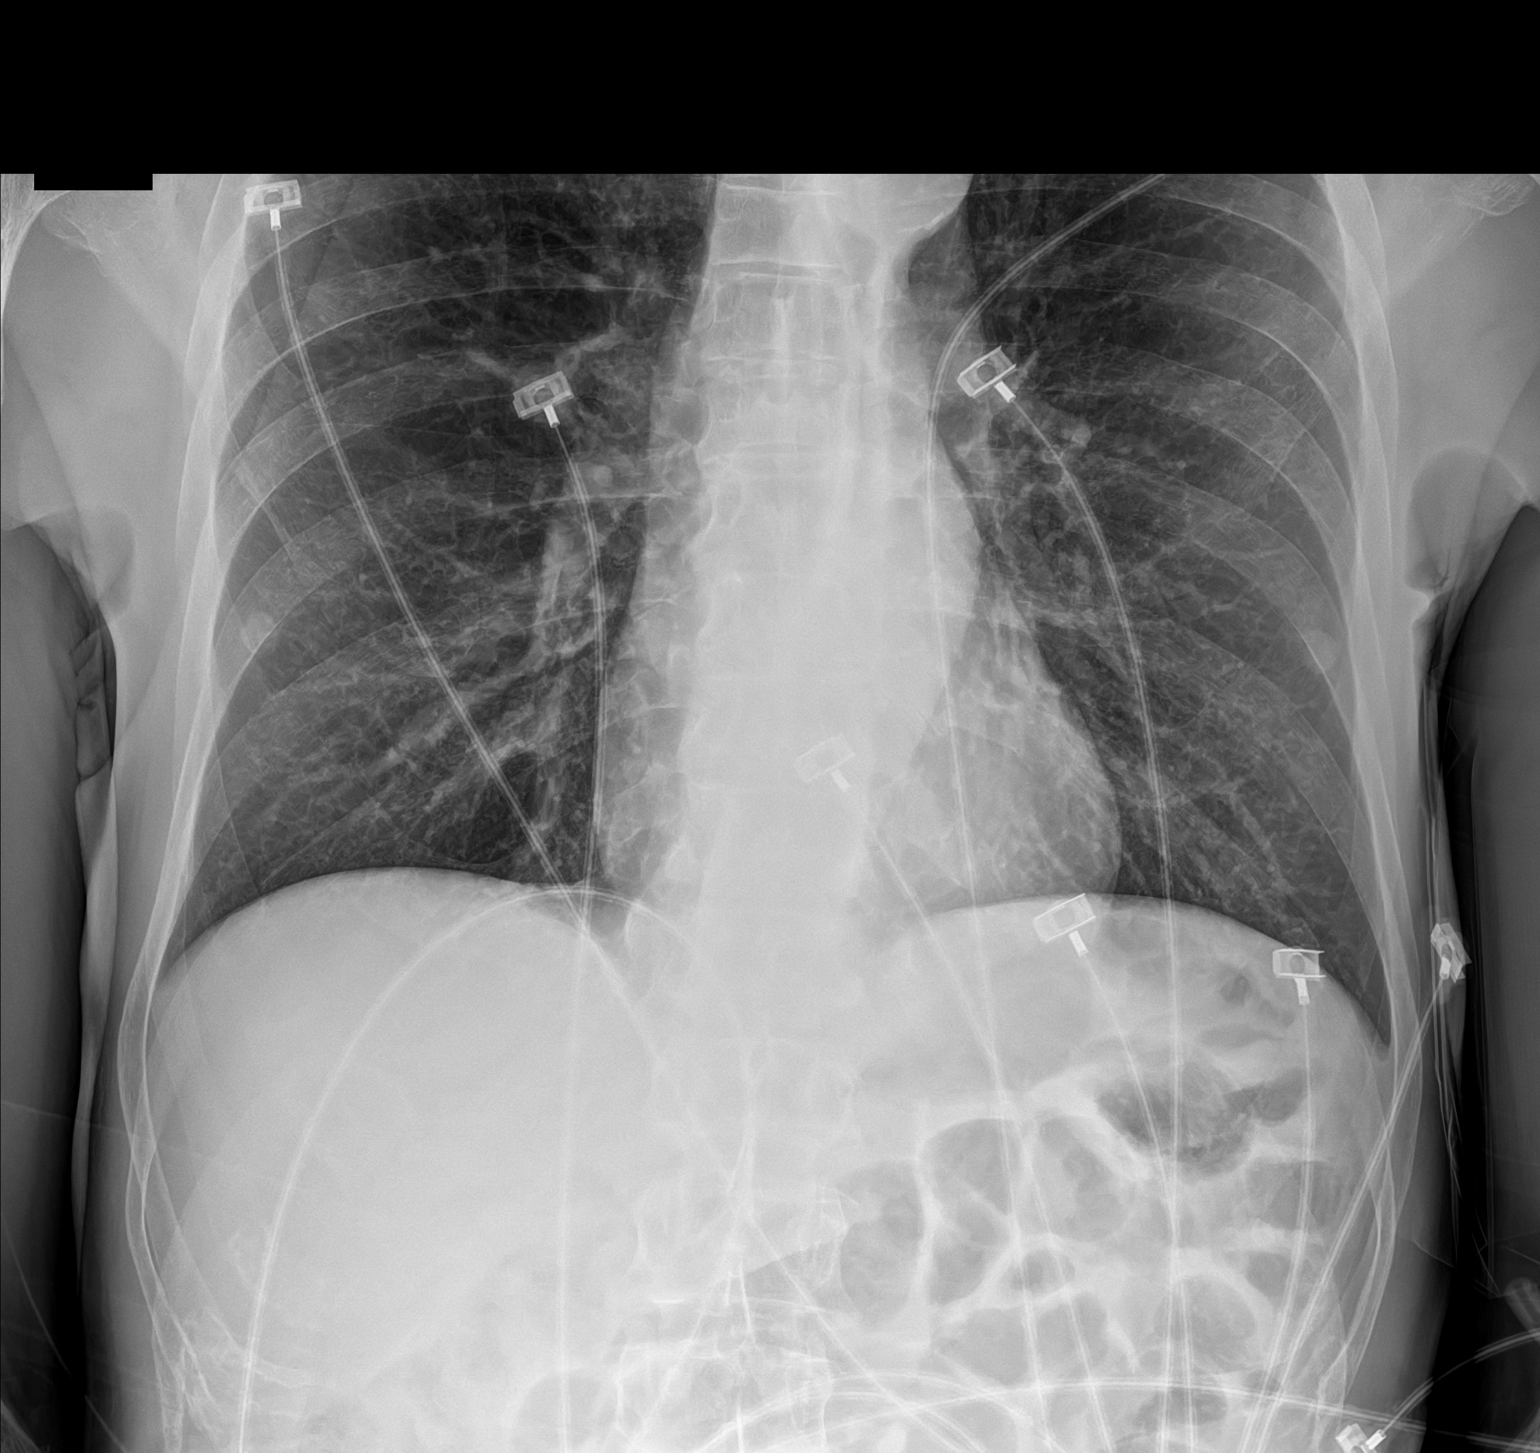

[2 of 2 positions shown; findings below may reference images not displayed]

FINDINGS: Stable cardiomediastinal silhouette with normal heart size. No
pneumothorax. No pleural effusion. Lungs appear clear, with no acute
consolidative airspace disease and no pulmonary edema.
IMPRESSION: No active disease.

## 2022-09-13 ENCOUNTER — Encounter: Payer: Self-pay | Admitting: *Deleted

## 2022-10-09 ENCOUNTER — Emergency Department (HOSPITAL_COMMUNITY)
Admission: EM | Admit: 2022-10-09 | Discharge: 2022-10-09 | Disposition: A | Discharge status: Home / Self Care | Payer: Medicare HMO | Attending: Emergency Medicine | Admitting: Emergency Medicine

## 2022-10-09 ENCOUNTER — Encounter (HOSPITAL_COMMUNITY): Payer: Self-pay

## 2022-10-09 ENCOUNTER — Emergency Department (HOSPITAL_COMMUNITY): Payer: Medicare HMO

## 2022-10-09 ENCOUNTER — Other Ambulatory Visit: Payer: Self-pay

## 2022-10-09 DIAGNOSIS — Z20822 Contact with and (suspected) exposure to covid-19: Secondary | ICD-10-CM | POA: Insufficient documentation

## 2022-10-09 DIAGNOSIS — J441 Chronic obstructive pulmonary disease with (acute) exacerbation: Secondary | ICD-10-CM | POA: Diagnosis not present

## 2022-10-09 DIAGNOSIS — Z7951 Long term (current) use of inhaled steroids: Secondary | ICD-10-CM | POA: Insufficient documentation

## 2022-10-09 DIAGNOSIS — R0602 Shortness of breath: Secondary | ICD-10-CM | POA: Diagnosis present

## 2022-10-09 LAB — CBC WITH DIFFERENTIAL/PLATELET
Abs Immature Granulocytes: 0.01 10*3/uL (ref 0.00–0.07)
Basophils Absolute: 0.1 10*3/uL (ref 0.0–0.1)
Basophils Relative: 1 %
Eosinophils Absolute: 0.6 10*3/uL — ABNORMAL HIGH (ref 0.0–0.5)
Eosinophils Relative: 14 %
HCT: 42.9 % (ref 39.0–52.0)
Hemoglobin: 13.1 g/dL (ref 13.0–17.0)
Immature Granulocytes: 0 %
Lymphocytes Relative: 25 %
Lymphs Abs: 1.1 10*3/uL (ref 0.7–4.0)
MCH: 26 pg (ref 26.0–34.0)
MCHC: 30.5 g/dL (ref 30.0–36.0)
MCV: 85.1 fL (ref 80.0–100.0)
Monocytes Absolute: 0.6 10*3/uL (ref 0.1–1.0)
Monocytes Relative: 15 %
Neutro Abs: 1.9 10*3/uL (ref 1.7–7.7)
Neutrophils Relative %: 45 %
Platelets: 128 10*3/uL — ABNORMAL LOW (ref 150–400)
RBC: 5.04 MIL/uL (ref 4.22–5.81)
RDW: 16.2 % — ABNORMAL HIGH (ref 11.5–15.5)
WBC: 4.2 10*3/uL (ref 4.0–10.5)
nRBC: 0 % (ref 0.0–0.2)

## 2022-10-09 LAB — BASIC METABOLIC PANEL
Anion gap: 6 (ref 5–15)
BUN: 13 mg/dL (ref 8–23)
CO2: 30 mmol/L (ref 22–32)
Calcium: 8.8 mg/dL — ABNORMAL LOW (ref 8.9–10.3)
Chloride: 104 mmol/L (ref 98–111)
Creatinine, Ser: 1.01 mg/dL (ref 0.61–1.24)
GFR, Estimated: >60 mL/min (ref >60)
Glucose, Bld: 86 mg/dL (ref 70–99)
Potassium: 3.6 mmol/L (ref 3.5–5.1)
Sodium: 140 mmol/L (ref 135–145)

## 2022-10-09 LAB — BRAIN NATRIURETIC PEPTIDE: B Natriuretic Peptide: 16 pg/mL (ref 0.0–100.0)

## 2022-10-09 LAB — SARS CORONAVIRUS 2 BY RT PCR: SARS Coronavirus 2 by RT PCR: NEGATIVE

## 2022-10-09 LAB — TROPONIN I (HIGH SENSITIVITY): Troponin I (High Sensitivity): 3 ng/L (ref <18)

## 2022-10-09 MED ORDER — AZITHROMYCIN 250 MG PO TABS: 250.0000 mg | ORAL_TABLET | Freq: Every day | ORAL | 0 refills | Status: DC

## 2022-10-09 MED ORDER — MAGNESIUM SULFATE 2 GM/50ML IV SOLN
2.0000 g | Freq: Once | INTRAVENOUS | Status: AC
  Administered 2022-10-09: 2 g via INTRAVENOUS
  Filled 2022-10-09: qty 50

## 2022-10-09 MED ORDER — IPRATROPIUM-ALBUTEROL 0.5-2.5 (3) MG/3ML IN SOLN: 3.0000 mL | Freq: Four times a day (QID) | RESPIRATORY_TRACT | 0 refills | Status: AC | PRN

## 2022-10-09 MED ORDER — IPRATROPIUM-ALBUTEROL 0.5-2.5 (3) MG/3ML IN SOLN
3.0000 mL | Freq: Once | RESPIRATORY_TRACT | Status: AC
  Administered 2022-10-09: 3 mL via RESPIRATORY_TRACT
  Filled 2022-10-09: qty 3

## 2022-10-09 MED ORDER — SODIUM CHLORIDE 0.9 % IV BOLUS
500.0000 mL | Freq: Once | INTRAVENOUS | Status: AC
  Administered 2022-10-09: 500 mL via INTRAVENOUS

## 2022-10-09 MED ORDER — PREDNISONE 20 MG PO TABS: 40.0000 mg | ORAL_TABLET | Freq: Every day | ORAL | 0 refills | Status: AC

## 2022-10-09 MED ORDER — METHYLPREDNISOLONE SODIUM SUCC 125 MG IJ SOLR
125.0000 mg | Freq: Once | INTRAMUSCULAR | Status: AC
  Administered 2022-10-09: 125 mg via INTRAVENOUS
  Filled 2022-10-09: qty 2

## 2022-10-09 MED ORDER — CEFPODOXIME PROXETIL 200 MG PO TABS: 200.0000 mg | ORAL_TABLET | Freq: Two times a day (BID) | ORAL | 0 refills | Status: AC

## 2022-10-09 NOTE — ED Provider Notes (Signed)
[facility] EMERGENCY DEPARTMENT AT [facility] Provider Note   CSN: [id number] Arrival date & time: 10/09/22  1330     History  Chief Complaint  Patient presents with   Shortness of Breath    Jonathan Atkinson is a 72 y.o. male, history of COPD, who presents to the ED secondary to shortness of breath, increased sputum production, cough it has been going on for the past week.  He states he had a runny nose, cough, sore throat, and since then his symptoms of gotten worse.  Denies any fevers or chills.  Reports that he does have a little bit of left-sided chest pain.  Denies any recent injuries, surgeries, leg swelling, or fast heart rate.     Home Medications Prior to Admission medications   Medication Sig Start Date End Date Taking? Authorizing Provider  azithromycin (ZITHROMAX) 250 MG tablet Take 1 tablet (250 mg total) by mouth daily. Take first 2 tablets together, then 1 every day until finished. 10/09/22  Yes Ami Thornsberry L, PA  cefpodoxime (VANTIN) 200 MG tablet Take 1 tablet (200 mg total) by mouth 2 (two) times daily for 5 days. 10/09/22 10/14/22 Yes Shaela Boer L, PA  ipratropium-albuterol (DUONEB) 0.5-2.5 (3) MG/3ML SOLN Take 3 mLs by nebulization every 6 (six) hours as needed. 10/09/22  Yes Neri Vieyra L, PA  predniSONE (DELTASONE) 20 MG tablet Take 2 tablets (40 mg total) by mouth daily for 5 days. 10/09/22 10/14/22 Yes Deniya Craigo L, PA  albuterol (VENTOLIN HFA) 108 (90 Base) MCG/ACT inhaler Inhale 2 puffs into the lungs every 4 (four) hours as needed. 06/14/19   [provider]  cephALEXin (KEFLEX) 500 MG capsule Take 1 capsule (500 mg total) by mouth 4 (four) times daily. 03/21/22   Terrilee Files, MD  fluticasone (FLONASE) 50 MCG/ACT nasal spray Place 1 spray into both nostrils daily. 05/24/19   [provider]  loratadine (CLARITIN) 10 MG tablet Take 10 mg by mouth daily. 05/24/19   [provider]  methocarbamol (ROBAXIN) 500 MG tablet  Take 1 tablet (500 mg total) by mouth 3 (three) times daily. Patient not taking: Reported on 06/24/2019 12/27/14   Triplett, Tammy, PA-C  naproxen (NAPROSYN) 500 MG tablet Take 1 tablet (500 mg total) by mouth 2 (two) times daily with a meal. Patient not taking: Reported on 06/24/2019 12/27/14   Triplett, Tammy, PA-C  STIOLTO RESPIMAT 2.5-2.5 MCG/ACT AERS Inhale 1 puff into the lungs daily. 06/14/19   [provider]      Allergies    Penicillins    Review of Systems   Review of Systems  Respiratory:  Positive for shortness of breath.     Physical Exam Updated Vital Signs BP 118/78 (BP Location: Right Arm)   Pulse 97   Temp 98.8 F (37.1 C) (Oral)   Resp 20   Wt 68 kg   SpO2 99%   BMI 20.92 kg/m  Physical Exam Vitals and nursing note reviewed.  Constitutional:      General: He is not in acute distress.    Appearance: He is well-developed.  HENT:     Head: Normocephalic and atraumatic.  Eyes:     Conjunctiva/sclera: Conjunctivae normal.  Cardiovascular:     Rate and Rhythm: Normal rate and regular rhythm.     Heart sounds: No murmur heard. Pulmonary:     Effort: Pulmonary effort is normal. No respiratory distress.     Breath sounds: Examination of the right-upper field reveals  wheezing. Examination of the left-upper field reveals decreased breath sounds. Examination of the right-lower field reveals wheezing. Examination of the left-lower field reveals decreased breath sounds. Decreased breath sounds and wheezing present.  Chest:     Comments: Mild chest wall tenderness to palpation of left lateral chest.  No edema, ecchymosis, or crepitus noted. Abdominal:     Palpations: Abdomen is soft.     Tenderness: There is no abdominal tenderness.  Musculoskeletal:        General: No swelling.     Cervical back: Neck supple.  Skin:    General: Skin is warm and dry.     Capillary Refill: Capillary refill takes less than 2 seconds.  Neurological:     Mental Status: He is  alert.  Psychiatric:        Mood and Affect: Mood normal.     ED Results / Procedures / Treatments   Labs (all labs ordered are listed, but only abnormal results are displayed) Labs Reviewed  BASIC METABOLIC PANEL - Abnormal; Notable for the following components:      Result Value   Calcium 8.8 (*)    All other components within normal limits  CBC WITH DIFFERENTIAL/PLATELET - Abnormal; Notable for the following components:   RDW 16.2 (*)    Platelets 128 (*)    Eosinophils Absolute 0.6 (*)    All other components within normal limits  SARS CORONAVIRUS 2 BY RT PCR  BRAIN NATRIURETIC PEPTIDE  TROPONIN I (HIGH SENSITIVITY)  TROPONIN I (HIGH SENSITIVITY)    EKG EKG Interpretation Date/Time:  Sunday October 09 2022 13:40:52 EDT Ventricular Rate:  92 PR Interval:  146 QRS Duration:  80 QT Interval:  358 QTC Calculation: 442 R Axis:   68  Text Interpretation: Normal sinus rhythm Minimal voltage criteria for LVH, may be normal variant ( Sokolow-Lyon ) Borderline ECG When compared with ECG of 26-Mar-2020 10:17, PREVIOUS ECG IS PRESENT since last tracing no significant change Confirmed by Eber Hong ([id number]) on 10/09/2022 3:20:54 PM  Radiology DG Chest 2 View  Result Date: 10/09/2022 CLINICAL DATA:  Shortness of breath, cough EXAM: CHEST - 2 VIEW COMPARISON:  03/21/2022 FINDINGS: Lungs clear. Heart size and mediastinal contours are within normal limits. Aortic Atherosclerosis (ICD10-170.0). No effusion. Visualized bones unremarkable. IMPRESSION: No acute cardiopulmonary disease. Electronically Signed   By: Corlis Leak M.D.   On: 10/09/2022 14:24    Procedures Procedures    Medications Ordered in ED Medications  magnesium sulfate IVPB 2 g 50 mL (2 g Intravenous New Bag/Given 10/09/22 1613)  sodium chloride 0.9 % bolus 500 mL (has no administration in time range)  methylPREDNISolone sodium succinate (SOLU-MEDROL) 125 mg/2 mL injection 125 mg (125 mg Intravenous Given 10/09/22  1605)  ipratropium-albuterol (DUONEB) 0.5-2.5 (3) MG/3ML nebulizer solution 3 mL (3 mLs Nebulization Given 10/09/22 1604)    ED Course/ Medical Decision Making/ A&P                                 Medical Decision Making Patient is a 72 year old male, here for increased sputum, cough, shortness of breath is been going on for the last week.  States he has had URI symptoms as well.  Will obtain chest x-ray, EKG, troponin given the left chest wall tenderness.  He has not had any surgery, trauma.  Symptoms align with a COPD exacerbation, however we will evaluate further, give a DuoNeb, as he is wheezing  throughout, diminished lung sounds, left lung.   Amount and/or Complexity of Data Reviewed Labs:     Details: Within normal limits Radiology: ordered.    Details: Chest x-ray clear ECG/medicine tests:  Decision-making details documented in ED Course. Discussion of management or test interpretation with external provider(s): Discussed with patient, after COPD meds, DuoNeb, magnesium, Solu-Medrol, patient has increased breath sounds, and reduced wheezing.  He is feeling much better.  On suspicious for COPD exacerbation, chest pain likely secondary to movement of the chest wall muscles.  We will have him follow-up with his PCP, discharged on cefpodoxime, azithromycin, prednisone, DuoNebs for COPD exacerbation.  Strict return precautions emphasized  Risk Prescription drug management.   Final Clinical Impression(s) / ED Diagnoses Final diagnoses:  COPD exacerbation (HCC)    Rx / DC Orders ED Discharge Orders          Ordered    ipratropium-albuterol (DUONEB) 0.5-2.5 (3) MG/3ML SOLN  Every 6 hours PRN        10/09/22 1649    predniSONE (DELTASONE) 20 MG tablet  Daily        10/09/22 1649    azithromycin (ZITHROMAX) 250 MG tablet  Daily        10/09/22 1649    cefpodoxime (VANTIN) 200 MG tablet  2 times daily        10/09/22 1649              Allyah Heather, [place], [place] 10/09/22 1657     Eber Hong, MD 10/10/22 [phone]

## 2022-10-09 NOTE — ED Triage Notes (Signed)
C/o sob with productive cough x 12 hours. Denies cp.  Hx copd Denies recent steroid usage.

## 2022-10-09 NOTE — Discharge Instructions (Addendum)
Please follow-up with your primary care doctor, take the medications as prescribed.  Return to the ER if you feel like your symptoms are worsening.  Take the DuoNebs every 4-6 hours, as needed for your shortness of breath.  If you have worsening chest pain return to the ER

## 2022-10-09 NOTE — ED Provider Triage Note (Signed)
Emergency Medicine Provider Triage Evaluation Note  Jonathan Atkinson , a 72 y.o. male  was evaluated in triage.  Pt complains of history of COPD, shortness of breath but feeling better now, has some left lateral chest pain earlier this morning that was brief and now resolved..  Review of Systems  Positive: Dyspnea Negative: Fever, chills  Physical Exam  BP 118/78 (BP Location: Right Arm)   Pulse 97   Temp 98.8 F (37.1 C) (Oral)   Resp 20   Wt 68 kg   SpO2 96%   BMI 20.92 kg/m  Gen:   Awake, no distress   Resp:  Normal effort  MSK:   Moves extremities without difficulty  Other:    Medical Decision Making  Medically screening exam initiated at 2:48 PM.  Appropriate orders placed.  Mickel Duhamel was informed that the remainder of the evaluation will be completed by another provider, this initial triage assessment does not replace that evaluation, and the importance of remaining in the ED until their evaluation is complete.     Ma Rings, [place] 10/09/22 1448

## 2023-03-10 ENCOUNTER — Ambulatory Visit: Payer: Medicare HMO | Admitting: Nurse Practitioner

## 2023-03-10 ENCOUNTER — Encounter: Payer: Self-pay | Admitting: Nurse Practitioner

## 2023-03-10 VITALS — BP 147/85 | HR 50 | Temp 97.6°F | Resp 20 | Ht 71.0 in | Wt 153.0 lb

## 2023-03-10 DIAGNOSIS — J449 Chronic obstructive pulmonary disease, unspecified: Secondary | ICD-10-CM

## 2023-03-10 NOTE — Progress Notes (Signed)
 Pt was seen a month ago here in [place], nebulizer machine broke a month ago, and pt has been without it x 1 month. States he is continuing to [employer] with [facility] at [facility] and informed pt needs prior authorization, messaged left for provider for approval. Pt hypertensive. Recheck better. Pt has appt with [facility] on 03/14/23 at 3:40 on [place]. Pt given home BP monitoring cuff and sheet to monitor BP. Pt instructed on how to use it and keep track. Pt verbalizes understanding and plans to take his BP log to his [employer].

## 2023-03-13 NOTE — Progress Notes (Signed)
 Pt here for replacement nebulizer.  He states he is doing well and no other needs.  No physical assessment performed. Pt was picking up food at distribution site and did not want to stay for a visit due to time constraints.  Kemper Durie, RN assisted him with connections to a nebulizer.

## 2023-03-13 NOTE — Congregational Nurse Program (Signed)
  Dept: [phone]   Congregational Nurse Program Note  Date of Encounter: 03/13/2023  Past Medical History: Past Medical History:  Diagnosis Date   Back pain    COPD (chronic obstructive pulmonary disease) (HCC)     Encounter Details:  Community Questionnaire - 03/13/23 0800       Questionnaire   Ask client: Do you give verbal consent for me to treat you today? N/A    Student Assistance N/A    Location Patient Served  [facility]    Encounter Setting Other    Population Status Unknown    Insurance Unknown    Insurance/Financial Assistance Referral N/A    Medication N/A    Medical Provider Yes    Screening Referrals Made N/A    Medical Referrals Made [facility]    Medical Appointment Completed [facility]    CNP Interventions Advocate/Support    Screenings CN Performed N/A    ED Visit Averted N/A    Life-Saving Intervention Made N/A            Late entry for 03/10/23  Jonathan Atkinson was being seen on [employer] unit today. He has a PCP and has a history of COPD. He has been without his nebulizer to take his treatments for a month and cannot afford. Price checked at [employer] who sells DME locally. Cost is $44.99 with prescription. Jonathan Atkinson cannot afford this. CN consulted for possible resource. [employer] [facility] called and she currently does not have any nebulizer machines. Jonathan Atkinson has the medication, he just needs the unit to deliver the treatment.  He has an upcoming appointment with his Primary Care next week, confirmed by [facility] staff.  Called CN office as [facility] does have an account with [employer]. Esther Hardy discussed and confirmed the funds, Neal Dy not available until 4:30 which apothecary states will be too late for him to get it filled today. Esther Hardy recommends to move forward, [employer] would like to speak to Esther Hardy to assure where billing is sent, since office at [facility] has relocated.  Esther Hardy provided with [facility] contact information and she did clear everything to move forward.  Jonathan Atkinson will take his prescription and [facility] community nursing voucher filled out with him to pick up his nebulizer.  Update from Shawn Route., RN [facility] on 03/11/23 confirmed with this RN that Jonathan Atkinson was able to get the nebulizer on 03/10/23.  Francee Nodal RN [facility] [employer].

## 2023-03-16 ENCOUNTER — Encounter (INDEPENDENT_AMBULATORY_CARE_PROVIDER_SITE_OTHER): Payer: Self-pay | Admitting: *Deleted

## 2023-05-01 ENCOUNTER — Other Ambulatory Visit: Payer: Self-pay | Admitting: Family Medicine

## 2023-05-01 DIAGNOSIS — R221 Localized swelling, mass and lump, neck: Secondary | ICD-10-CM

## 2023-05-22 ENCOUNTER — Ambulatory Visit
Admission: RE | Admit: 2023-05-22 | Discharge: 2023-05-22 | Disposition: A | Discharge status: Home / Self Care | Source: Ambulatory Visit | Attending: Family Medicine | Admitting: Family Medicine

## 2023-05-22 ENCOUNTER — Other Ambulatory Visit

## 2023-05-22 DIAGNOSIS — R221 Localized swelling, mass and lump, neck: Secondary | ICD-10-CM

## 2023-05-22 MED ORDER — IOPAMIDOL (ISOVUE-300) INJECTION 61%
500.0000 mL | Freq: Once | INTRAVENOUS | Status: AC | PRN
  Administered 2023-05-22: 75 mL via INTRAVENOUS

## 2023-08-29 ENCOUNTER — Emergency Department (HOSPITAL_COMMUNITY)
Admission: EM | Admit: 2023-08-29 | Discharge: 2023-08-29 | Disposition: A | Discharge status: Home / Self Care | Attending: Emergency Medicine | Admitting: Emergency Medicine

## 2023-08-29 ENCOUNTER — Emergency Department (HOSPITAL_COMMUNITY)

## 2023-08-29 DIAGNOSIS — F17210 Nicotine dependence, cigarettes, uncomplicated: Secondary | ICD-10-CM | POA: Insufficient documentation

## 2023-08-29 DIAGNOSIS — M542 Cervicalgia: Secondary | ICD-10-CM | POA: Diagnosis present

## 2023-08-29 DIAGNOSIS — Z7951 Long term (current) use of inhaled steroids: Secondary | ICD-10-CM | POA: Diagnosis not present

## 2023-08-29 DIAGNOSIS — J449 Chronic obstructive pulmonary disease, unspecified: Secondary | ICD-10-CM | POA: Insufficient documentation

## 2023-08-29 LAB — CBC WITH DIFFERENTIAL/PLATELET
Abs Granulocyte: 1.5 K/uL (ref 1.5–6.5)
Abs Immature Granulocytes: 0.01 K/uL (ref 0.00–0.07)
Basophils Absolute: 0 K/uL (ref 0.0–0.1)
Basophils Relative: 1 %
Eosinophils Absolute: 0.5 K/uL (ref 0.0–0.5)
Eosinophils Relative: 13 %
HCT: 38.4 % — ABNORMAL LOW (ref 39.0–52.0)
Hemoglobin: 12.1 g/dL — ABNORMAL LOW (ref 13.0–17.0)
Immature Granulocytes: 0 %
Lymphocytes Relative: 34 %
Lymphs Abs: 1.3 K/uL (ref 0.7–4.0)
MCH: 26.9 pg (ref 26.0–34.0)
MCHC: 31.5 g/dL (ref 30.0–36.0)
MCV: 85.3 fL (ref 80.0–100.0)
Monocytes Absolute: 0.5 K/uL (ref 0.1–1.0)
Monocytes Relative: 12 %
Neutro Abs: 1.5 K/uL — ABNORMAL LOW (ref 1.7–7.7)
Neutrophils Relative %: 40 %
Platelets: 124 K/uL — ABNORMAL LOW (ref 150–400)
RBC: 4.5 MIL/uL (ref 4.22–5.81)
RDW: 15.8 % — ABNORMAL HIGH (ref 11.5–15.5)
WBC: 3.8 K/uL — ABNORMAL LOW (ref 4.0–10.5)
nRBC: 0 % (ref 0.0–0.2)

## 2023-08-29 LAB — COMPREHENSIVE METABOLIC PANEL WITH GFR
ALT: 12 U/L (ref 0–44)
AST: 16 U/L (ref 15–41)
Albumin: 3.3 g/dL — ABNORMAL LOW (ref 3.5–5.0)
Alkaline Phosphatase: 101 U/L (ref 38–126)
Anion gap: 8 (ref 5–15)
BUN: 13 mg/dL (ref 8–23)
CO2: 30 mmol/L (ref 22–32)
Calcium: 8.9 mg/dL (ref 8.9–10.3)
Chloride: 106 mmol/L (ref 98–111)
Creatinine, Ser: 1.18 mg/dL (ref 0.61–1.24)
GFR, Estimated: >60 mL/min (ref >60)
Glucose, Bld: 62 mg/dL — ABNORMAL LOW (ref 70–99)
Potassium: 4.3 mmol/L (ref 3.5–5.1)
Sodium: 144 mmol/L (ref 135–145)
Total Bilirubin: 0.4 mg/dL (ref 0.0–1.2)
Total Protein: 6.4 g/dL — ABNORMAL LOW (ref 6.5–8.1)

## 2023-08-29 MED ORDER — IOHEXOL 350 MG/ML SOLN
75.0000 mL | Freq: Once | INTRAVENOUS | Status: AC | PRN
  Administered 2023-08-29 (×2): 75 mL via INTRAVENOUS

## 2023-08-29 MED ORDER — HYDROCODONE-ACETAMINOPHEN 5-325 MG PO TABS
1.0000 | ORAL_TABLET | Freq: Once | ORAL | Status: AC
  Administered 2023-08-29 (×2): 1 via ORAL
  Filled 2023-08-29: qty 1

## 2023-08-29 NOTE — Discharge Instructions (Signed)
 Alternate Tylenol  and ibuprofen  at home for pain management.  Keep vascular appointment later this month for further management.  Return to ED if you notice new symptoms of weakness, loss of strength in extremities, severe dizziness, or loss of vision.

## 2023-08-29 NOTE — ED Triage Notes (Signed)
 Pt comes in for right neck pain. Pt is has a 'knot' in his neck. Pt called PCP and told pt to come here to be evaluated. Pt states he has trouble swallowing. Pt has ben having this for the past 2 weeks. Pt is A&Ox4.    Pt is seeing a pulmonologist on Friday for COPD.

## 2023-08-29 NOTE — ED Provider Notes (Signed)
 [place] EMERGENCY DEPARTMENT AT [facility] Provider Note   CSN: [id number] Arrival date & time: 08/29/23  1633     Patient presents with: Neck Pain   Jonathan Atkinson is a 73 y.o. male.  73 year old male presents to the ED with complaints of neck pain in the right carotid.  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  Patient reports he has recently been experiencing pain in the mass for the last 2 weeks.  Pain has caused him to awaken at 2 AM several night.  And when he moves his neck sharply to the left he has pain as well.  Patient has seen his primary care today and they sent him here after taking x-rays.  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  Patient has a appointment with vascular this month.  Reports history of COPD and smoking cigarettes.  Patient advises he has to use his inhaler regularly but is not currently short of breath.  Patient denies any blurred vision, focal deficits, weakness, headaches, dizziness.     Prior to Admission medications   Medication Sig Start Date End Date Taking? Authorizing Provider  albuterol  (VENTOLIN  HFA) 108 (90 Base) MCG/ACT inhaler Inhale 2 puffs into the lungs every 4 (four) hours as needed. 06/14/19   [provider]  azithromycin  (ZITHROMAX ) 250 MG tablet Take 1 tablet (250 mg total) by mouth daily. Take first 2 tablets together, then 1 every day until finished. Patient not taking: Reported on 03/10/2023 10/09/22   Small, Brooke L, PA  cephALEXin  (KEFLEX ) 500 MG capsule Take 1 capsule (500 mg total) by mouth 4 (four) times daily. Patient not taking: Reported on 03/10/2023 03/21/22   Butler, Michael C, MD  fluticasone (FLONASE) 50 MCG/ACT nasal spray Place 1 spray into both nostrils daily. 05/24/19   [provider]  ipratropium-albuterol  (DUONEB) 0.5-2.5 (3) MG/3ML SOLN Take 3 mLs by nebulization every 6 (six) hours as needed. 10/09/22    Small, Brooke L, PA  loratadine (CLARITIN) 10 MG tablet Take 10 mg by mouth daily. 05/24/19   [provider]  methocarbamol  (ROBAXIN ) 500 MG tablet Take 1 tablet (500 mg total) by mouth 3 (three) times daily. Patient not taking: Reported on 06/24/2019 12/27/14   Herlinda Milling, PA-C  naproxen  (NAPROSYN ) 500 MG tablet Take 1 tablet (500 mg total) by mouth 2 (two) times daily with a meal. Patient not taking: Reported on 06/24/2019 12/27/14   Triplett, Tammy, PA-C  STIOLTO RESPIMAT 2.5-2.5 MCG/ACT AERS Inhale 1 puff into the lungs daily. Patient not taking: Reported on 03/10/2023 06/14/19   [provider]    Allergies: Penicillins    Review of Systems  Musculoskeletal:  Positive for neck pain. Negative for joint swelling.  Skin:  Negative for wound.  Neurological:  Negative for dizziness and headaches.  All other systems reviewed and are negative.   Updated Vital Signs BP 127/82   Pulse 80   Temp 98.4 F (36.9 C) (Oral)   Resp 18   Ht 5' 11 (1.803 m)   Wt 68 kg   SpO2 99%   BMI 20.92 kg/m   Physical Exam Vitals and nursing note reviewed.  Constitutional:      General: He is not in acute distress.    Appearance: Normal appearance. He is not ill-appearing.  HENT:     Head: Normocephalic and atraumatic.  Eyes:  Extraocular Movements: Extraocular movements intact.     Pupils: Pupils are equal, round, and reactive to light.  Neck:     Vascular: No carotid bruit.      Comments: Palpable mass over the right carotid. Pain to palpation.  Cardiovascular:     Rate and Rhythm: Normal rate.  Pulmonary:     Effort: Pulmonary effort is normal. No respiratory distress.  Abdominal:     Tenderness: There is no guarding.  Musculoskeletal:        General: Normal range of motion.     Cervical back: Normal range of motion.  Lymphadenopathy:     Cervical: No cervical adenopathy.  Skin:    General: Skin is warm and dry.  Neurological:     General: No focal deficit  present.     Mental Status: He is alert.  Psychiatric:        Mood and Affect: Mood normal.        Behavior: Behavior normal.     (all labs ordered are listed, but only abnormal results are displayed) Labs Reviewed  COMPREHENSIVE METABOLIC PANEL WITH GFR - Abnormal; Notable for the following components:      Result Value   Glucose, Bld 62 (*)    Total Protein 6.4 (*)    Albumin 3.3 (*)    All other components within normal limits  CBC WITH DIFFERENTIAL/PLATELET - Abnormal; Notable for the following components:   WBC 3.8 (*)    Hemoglobin 12.1 (*)    HCT 38.4 (*)    RDW 15.8 (*)    Platelets 124 (*)    Neutro Abs 1.5 (*)    All other components within normal limits    EKG: None  Radiology: CT ANGIO HEAD NECK W WO CM Result Date: 08/29/2023 CLINICAL DATA:  Initial evaluation for acute right neck pain. EXAM: CT ANGIOGRAPHY HEAD AND NECK WITH AND WITHOUT CONTRAST TECHNIQUE: Multidetector CT imaging of the head and neck was performed using the standard protocol during bolus administration of intravenous contrast. Multiplanar CT image reconstructions and MIPs were obtained to evaluate the vascular anatomy. Carotid stenosis measurements (when applicable) are obtained utilizing NASCET criteria, using the distal internal carotid diameter as the denominator. RADIATION DOSE REDUCTION: This exam was performed according to the departmental dose-optimization program which includes automated exposure control, adjustment of the mA and/or kV according to patient size and/or use of iterative reconstruction technique. CONTRAST:  75mL OMNIPAQUE  IOHEXOL  350 MG/ML SOLN COMPARISON:  None Available. FINDINGS: CT HEAD FINDINGS Brain: Cerebral volume within normal limits. Patchy hypodensity involving the supratentorial cerebral white matter, consistent with chronic small vessel ischemic disease, mild to moderate in nature. No acute intracranial hemorrhage. No acute large vessel territory infarct. No mass lesion  or midline shift. No hydrocephalus or extra-axial fluid collection. Vascular: No abnormal hyperdense vessel. Skull: Scalp soft tissues within normal limits.  Calvarium intact. Sinuses/Orbits: Globes orbital soft tissues within normal limits. Scattered mucosal thickening present throughout the sphenoid ethmoidal sinuses. No mastoid effusion. Other: None. Review of the MIP images confirms the above findings CTA NECK FINDINGS Aortic arch: Visualized aortic arch within normal limits for caliber with standard branch pattern. Mild aortic atherosclerosis. No stenosis about the origin the great vessels. Right carotid system: Right common and internal carotid origin of the cervical right ICA at the bifurcation is ectatic without discrete aneurysm. Left carotid system: Left common and internal carotid arteries are patent without stenosis or dissection. Vertebral arteries: Both vertebral arteries arise from subclavian arteries. No  proximal subclavian artery stenosis vertebral arteries are patent without stenosis or dissection. Skeleton: No worrisome osseous lesions. Mild for age cervical spondylosis. Patient is edentulous. Other neck: No other acute finding. Upper chest: Emphysema.  No other acute finding. Review of the MIP images confirms the above findings CTA HEAD FINDINGS Anterior circulation: Both internal carotid arteries are patent to the termini without stenosis. A1 segments patent bilaterally. Left A1 hypoplastic. Normal anterior communicating artery complex. Anterior cerebral arteries patent without stenosis. No M1 stenosis or occlusion. No proximal MCA branch occlusion. Distal MCA branches perfused and symmetric. Posterior circulation: Both V4 segments patent without stenosis. Left vertebral artery slightly dominant. Both PICA patent. Basilar patent without stenosis. Superior cerebellar and posterior cerebral arteries patent bilaterally. Venous sinuses: Patent allowing for timing the contrast bolus. Anatomic  variants: None significant.  No aneurysm. Review of the MIP images confirms the above findings IMPRESSION: 1. Negative CTA of the head and neck. No large vessel occlusion, hemodynamically significant stenosis, or other acute vascular abnormality. No aneurysm. 2. No other acute intracranial abnormality. 3. Mild to moderate chronic microvascular ischemic disease. Aortic Atherosclerosis (ICD10-I70.0) and Emphysema (ICD10-J43.9). Electronically Signed   By: Morene Hoard M.D.   On: 08/29/2023 21:27     Procedures   Medications Ordered in the ED  HYDROcodone -acetaminophen  (NORCO/VICODIN) 5-325 MG per tablet 1 tablet (1 tablet Oral Given 08/29/23 1850)  iohexol  (OMNIPAQUE ) 350 MG/ML injection 75 mL (75 mLs Intravenous Contrast Given 08/29/23 1855)                                Medical Decision Making Amount and/or Complexity of Data Reviewed Labs: ordered. Radiology: ordered.  Risk Prescription drug management.  73 y.o. male presents to the ED for concern of Neck Pain     This involves an extensive number of treatment options, and is a complaint that carries with it a high risk of complications and morbidity.  The emergent differential diagnosis prior to evaluation includes, but is not limited to: Aneurysm, CVA, abscess, carotid stenosis  This is not an exhaustive differential.   Past Medical History / Co-morbidities / Social History: Hx of COPD Social Determinants of Health include: Smoking cigarettes and socially drinking.  Additional History:  Obtained by chart review.  Notably follows with PCP for COPD and a recent scan of the neck resulting in referral to ED for follow-up with a mass on his neck.  Lab Tests: I ordered, and personally interpreted labs.  The pertinent results include: CBC and CMP with no acute abnormalities.  Imaging Studies: I ordered imaging studies including CTA of neck and head.   I independently visualized and interpreted imaging which showed no acute  abnormalities or aneurysms noted I agree with the radiologist interpretation.    ED Course / Critical Interventions: Pt well-appearing on exam.  Sitting in ED bed comfortably.  Patient has palpable mass near right carotid artery.  Patient reporting pain to palpation of the mass, and pain when he works to his left abruptly.  Patient has no focal deficits or weakness noted in the limbs.  Neurological exam shows no acute findings.  No bruits noted on auscultation of the carotid arteries.  No apparent shortness of breath and lungs are clear to auscultation.  No acute dizziness, syncope, visual disturbances.   Upon reevaluation, patient was much more comfortable after medication.  Patient was advised no findings on CTA and to continue with his appointment with  vascular.  Advised the patient to use Tylenol  and ibuprofen  as needed at home for pain management.  No changes to neuroexam.  Patient agreed to treatment plan was discharged.  Advised to return to ED for treatment. I have reviewed the patients home medicines and have made adjustments as needed.  Disposition: Considered admission and after reviewing the patient's encounter today, I feel that the patient would benefit from discharge and follow-up with vascular as planned.  Discussed course of treatment with the patient, whom demonstrated understanding.  Patient in agreement and has no further questions.    I discussed this case with my attending, Dr. Cleotilde, who agreed with the proposed treatment course and cosigned this note including patient's presenting symptoms, physical exam, and planned diagnostics and interventions.  Attending physician stated agreement with plan or made changes to plan which were implemented.     This chart was dictated using voice recognition software.  Despite best efforts to proofread, errors can occur which can change the documentation meaning.    Final diagnoses:  Neck pain    ED Discharge Orders     None           Myriam Fonda GORMAN DEVONNA 08/29/23 2213    Cleotilde Rogue, MD 08/31/23 1257

## 2023-09-28 ENCOUNTER — Emergency Department (HOSPITAL_COMMUNITY)
Admission: EM | Admit: 2023-09-28 | Discharge: 2023-09-28 | Disposition: A | Discharge status: Home / Self Care | Attending: Emergency Medicine | Admitting: Emergency Medicine

## 2023-09-28 ENCOUNTER — Other Ambulatory Visit: Payer: Self-pay

## 2023-09-28 ENCOUNTER — Encounter (HOSPITAL_COMMUNITY): Payer: Self-pay

## 2023-09-28 ENCOUNTER — Emergency Department (HOSPITAL_COMMUNITY)

## 2023-09-28 DIAGNOSIS — J441 Chronic obstructive pulmonary disease with (acute) exacerbation: Secondary | ICD-10-CM | POA: Insufficient documentation

## 2023-09-28 DIAGNOSIS — R059 Cough, unspecified: Secondary | ICD-10-CM | POA: Diagnosis present

## 2023-09-28 DIAGNOSIS — F17200 Nicotine dependence, unspecified, uncomplicated: Secondary | ICD-10-CM | POA: Insufficient documentation

## 2023-09-28 DIAGNOSIS — Z7951 Long term (current) use of inhaled steroids: Secondary | ICD-10-CM | POA: Diagnosis not present

## 2023-09-28 LAB — RESP PANEL BY RT-PCR (RSV, FLU A&B, COVID)  RVPGX2
Influenza A by PCR: NEGATIVE
Influenza B by PCR: NEGATIVE
Resp Syncytial Virus by PCR: NEGATIVE
SARS Coronavirus 2 by RT PCR: NEGATIVE

## 2023-09-28 LAB — CBC WITH DIFFERENTIAL/PLATELET
Abs Immature Granulocytes: 0.01 K/uL (ref 0.00–0.07)
Basophils Absolute: 0 K/uL (ref 0.0–0.1)
Basophils Relative: 1 %
Eosinophils Absolute: 0.5 K/uL (ref 0.0–0.5)
Eosinophils Relative: 10 %
HCT: 39.8 % (ref 39.0–52.0)
Hemoglobin: 12.6 g/dL — ABNORMAL LOW (ref 13.0–17.0)
Immature Granulocytes: 0 %
Lymphocytes Relative: 20 %
Lymphs Abs: 1.1 K/uL (ref 0.7–4.0)
MCH: 26.7 pg (ref 26.0–34.0)
MCHC: 31.7 g/dL (ref 30.0–36.0)
MCV: 84.3 fL (ref 80.0–100.0)
Monocytes Absolute: 0.6 K/uL (ref 0.1–1.0)
Monocytes Relative: 12 %
Neutro Abs: 2.9 K/uL (ref 1.7–7.7)
Neutrophils Relative %: 57 %
Platelets: 125 K/uL — ABNORMAL LOW (ref 150–400)
RBC: 4.72 MIL/uL (ref 4.22–5.81)
RDW: 15.9 % — ABNORMAL HIGH (ref 11.5–15.5)
WBC: 5.2 K/uL (ref 4.0–10.5)
nRBC: 0 % (ref 0.0–0.2)

## 2023-09-28 LAB — BASIC METABOLIC PANEL WITH GFR
Anion gap: 12 (ref 5–15)
BUN: 11 mg/dL (ref 8–23)
CO2: 24 mmol/L (ref 22–32)
Calcium: 8.8 mg/dL — ABNORMAL LOW (ref 8.9–10.3)
Chloride: 106 mmol/L (ref 98–111)
Creatinine, Ser: 1.03 mg/dL (ref 0.61–1.24)
GFR, Estimated: >60 mL/min (ref >60)
Glucose, Bld: 89 mg/dL (ref 70–99)
Potassium: 3.7 mmol/L (ref 3.5–5.1)
Sodium: 142 mmol/L (ref 135–145)

## 2023-09-28 LAB — BRAIN NATRIURETIC PEPTIDE: B Natriuretic Peptide: 13 pg/mL (ref 0.0–100.0)

## 2023-09-28 MED ORDER — HYDROCODONE-ACETAMINOPHEN 5-325 MG PO TABS
1.0000 | ORAL_TABLET | Freq: Once | ORAL | Status: AC
  Administered 2023-09-28: 1 via ORAL
  Filled 2023-09-28: qty 1

## 2023-09-28 MED ORDER — AZITHROMYCIN 250 MG PO TABS: 250.0000 mg | ORAL_TABLET | Freq: Every day | ORAL | 0 refills | Status: DC

## 2023-09-28 MED ORDER — IPRATROPIUM-ALBUTEROL 0.5-2.5 (3) MG/3ML IN SOLN
3.0000 mL | Freq: Once | RESPIRATORY_TRACT | Status: AC
  Administered 2023-09-28: 3 mL via RESPIRATORY_TRACT
  Filled 2023-09-28: qty 3

## 2023-09-28 MED ORDER — MAGNESIUM SULFATE 2 GM/50ML IV SOLN
2.0000 g | Freq: Once | INTRAVENOUS | Status: AC
  Administered 2023-09-28: 2 g via INTRAVENOUS
  Filled 2023-09-28: qty 50

## 2023-09-28 MED ORDER — METHYLPREDNISOLONE SODIUM SUCC 125 MG IJ SOLR
125.0000 mg | Freq: Once | INTRAMUSCULAR | Status: AC
  Administered 2023-09-28: 125 mg via INTRAVENOUS
  Filled 2023-09-28: qty 2

## 2023-09-28 MED ORDER — PREDNISONE 20 MG PO TABS: 60.0000 mg | ORAL_TABLET | Freq: Every day | ORAL | 0 refills | Status: DC

## 2023-09-28 NOTE — ED Provider Notes (Signed)
 [facility]PARTMENT AT [facility] Provider Note   CSN: [id number] Arrival date & time: 09/28/23  1016     Patient presents with: Shortness of Breath   Jonathan Atkinson is a 73 y.o. male.  He is here with a complaint of shortness of breath cough productive of some gray and black sputum that is been going on for over a week.  He said he was here about a month ago for same.  He has seen his primary care doctor for it.  Using inhaler and nebulizer without improvement.  He is not sure if he has had a fever.  He has had some vomiting but he thinks is secondary to the cough.  No chest pain.  He is a smoker.  He is also worried about a lump on the right side of his neck that is been painful.  He said it has been there a while though.   The history is provided by the patient.  Shortness of Breath Severity:  Moderate Onset quality:  Gradual Duration:  1 week Timing:  Constant Progression:  Worsening Chronicity:  Recurrent Context: URI   Relieved by:  Nothing Worsened by:  Activity and coughing Ineffective treatments:  Inhaler and rest Associated symptoms: cough, sputum production, swollen glands and wheezing   Associated symptoms: no abdominal pain, no chest pain, no fever and no hemoptysis   Risk factors: tobacco use        Prior to Admission medications   Medication Sig Start Date End Date Taking? Authorizing Provider  albuterol  (VENTOLIN  HFA) 108 (90 Base) MCG/ACT inhaler Inhale 2 puffs into the lungs every 4 (four) hours as needed. 06/14/19   [provider]  azithromycin  (ZITHROMAX ) 250 MG tablet Take 1 tablet (250 mg total) by mouth daily. Take first 2 tablets together, then 1 every day until finished. Patient not taking: Reported on 03/10/2023 10/09/22   Small, Brooke L, PA  cephALEXin  (KEFLEX ) 500 MG capsule Take 1 capsule (500 mg total) by mouth 4 (four) times daily. Patient not taking: Reported on 03/10/2023 03/21/22   Ziere Docken C, MD   fluticasone (FLONASE) 50 MCG/ACT nasal spray Place 1 spray into both nostrils daily. 05/24/19   [provider]  ipratropium-albuterol  (DUONEB) 0.5-2.5 (3) MG/3ML SOLN Take 3 mLs by nebulization every 6 (six) hours as needed. 10/09/22   Small, Brooke L, PA  loratadine (CLARITIN) 10 MG tablet Take 10 mg by mouth daily. 05/24/19   [provider]  methocarbamol  (ROBAXIN ) 500 MG tablet Take 1 tablet (500 mg total) by mouth 3 (three) times daily. Patient not taking: Reported on 06/24/2019 12/27/14   Herlinda Milling, PA-C  naproxen  (NAPROSYN ) 500 MG tablet Take 1 tablet (500 mg total) by mouth 2 (two) times daily with a meal. Patient not taking: Reported on 06/24/2019 12/27/14   Triplett, Tammy, PA-C  STIOLTO RESPIMAT 2.5-2.5 MCG/ACT AERS Inhale 1 puff into the lungs daily. Patient not taking: Reported on 03/10/2023 06/14/19   [provider]    Allergies: Penicillins    Review of Systems  Constitutional:  Negative for fever.  Respiratory:  Positive for cough, sputum production, shortness of breath and wheezing. Negative for hemoptysis.   Cardiovascular:  Negative for chest pain.  Gastrointestinal:  Negative for abdominal pain.    Updated Vital Signs BP (!) 140/96   Pulse 75   Resp (!) 24   Ht 5' 11 (1.803 m)   Wt 69.9 kg   SpO2 95%   BMI  21.48 kg/m   Physical Exam Vitals and nursing note reviewed.  Constitutional:      General: He is not in acute distress.    Appearance: He is well-developed.  HENT:     Head: Normocephalic and atraumatic.  Eyes:     Conjunctiva/sclera: Conjunctivae normal.  Cardiovascular:     Rate and Rhythm: Normal rate and regular rhythm.     Heart sounds: No murmur heard. Pulmonary:     Effort: Tachypnea and accessory muscle usage present. No respiratory distress.     Breath sounds: Wheezing and rhonchi present.  Abdominal:     Palpations: Abdomen is soft.     Tenderness: There is no abdominal tenderness.  Musculoskeletal:         General: No swelling.     Cervical back: Neck supple.     Right lower leg: No tenderness. No edema.     Left lower leg: No tenderness. No edema.  Lymphadenopathy:     Cervical: Cervical adenopathy present.  Skin:    General: Skin is warm and dry.     Capillary Refill: Capillary refill takes less than 2 seconds.  Neurological:     General: No focal deficit present.     Mental Status: He is alert.     (all labs ordered are listed, but only abnormal results are displayed) Labs Reviewed  BASIC METABOLIC PANEL WITH GFR - Abnormal; Notable for the following components:      Result Value   Calcium 8.8 (*)    All other components within normal limits  CBC WITH DIFFERENTIAL/PLATELET - Abnormal; Notable for the following components:   Hemoglobin 12.6 (*)    RDW 15.9 (*)    Platelets 125 (*)    All other components within normal limits  RESP PANEL BY RT-PCR (RSV, FLU A&B, COVID)  RVPGX2  BRAIN NATRIURETIC PEPTIDE    EKG: EKG Interpretation Date/Time:  Thursday September 28 2023 11:24:10 EDT Ventricular Rate:  74 PR Interval:  152 QRS Duration:  79 QT Interval:  383 QTC Calculation: 425 R Axis:   69  Text Interpretation: Sinus rhythm Consider left ventricular hypertrophy No significant change since prior 9/24 Confirmed by Towana Sharper [phone]) on 09/28/2023 11:25:40 AM  Radiology: [facility] Chest 2 View Result Date: 09/28/2023 CLINICAL DATA:  Cough and shortness of breath for the past 2 days. EXAM: CHEST - 2 VIEW COMPARISON:  10/09/2022 FINDINGS: Normal-sized heart. Clear lungs with normal vascularity. Mild peribronchial thickening with improvement. No airspace consolidation. Bilateral nipple shadows. Mild thoracic spine degenerative changes. IMPRESSION: Mild bronchitic changes with improvement. Electronically Signed   By: Elspeth Bathe M.D.   On: 09/28/2023 10:54     Procedures   Medications Ordered in the ED  methylPREDNISolone  sodium succinate (SOLU-MEDROL ) 125 mg/2 mL injection  125 mg (125 mg Intravenous Given 09/28/23 1132)  ipratropium-albuterol  (DUONEB) 0.5-2.5 (3) MG/3ML nebulizer solution 3 mL (3 mLs Nebulization Given 09/28/23 1140)  magnesium  sulfate IVPB 2 g 50 mL (0 g Intravenous Stopped 09/28/23 1233)  HYDROcodone -acetaminophen  (NORCO/VICODIN) 5-325 MG per tablet 1 tablet (1 tablet Oral Given 09/28/23 1241)    Clinical Course as of 09/28/23 1738  Thu Sep 28, 2023  1333 Patient states he feels much better after medications.  He is comfortable plan for discharge. [MB]    Clinical Course User Index [MB] Towana Sharper BROCKS, MD  Medical Decision Making Amount and/or Complexity of Data Reviewed Labs: ordered. Radiology: ordered.  Risk Prescription drug management.   This patient complains of cough and shortness of breath; this involves an extensive number of treatment Options and is a complaint that carries with it a high risk of complications and morbidity. The differential includes COPD, CHF, COVID, flu, pneumonia, pneumothorax, PE  I ordered, reviewed and interpreted labs, which included CBC with chronically low platelets and hemoglobin, chemistries unremarkable, BNP normal, COVID and flu negative I ordered medication IV magnesium , steroids, breathing treatment, pain medicine and reviewed PMP when indicated. I ordered imaging studies which included chest x-ray and I independently    visualized and interpreted imaging which showed mild bronchitic changes Previous records obtained and reviewed in epic including recent PCP visits and ED visits Cardiac monitoring reviewed, sinus rhythm Social determinants considered, tobacco use Food insecurity Critical Interventions: None  After the interventions stated above, I reevaluated the patient and found patient to be breathing much more comfortably and decreased wheezing Admission and further testing considered, he is comfortable plan for discharge.  Recommended limiting tobacco  and close follow-up with PCP.  Will cover with antibiotics and steroids.  Return instructions discussed      Final diagnoses:  COPD exacerbation [facility])    ED Discharge Orders          Ordered    predniSONE  (DELTASONE ) 20 MG tablet  Daily        09/28/23 1341    azithromycin  (ZITHROMAX ) 250 MG tablet  Daily        09/28/23 1341               Towana Ozell BROCKS, MD 09/28/23 1740

## 2023-09-28 NOTE — ED Triage Notes (Signed)
 Pt c/o ShOB. Onset two days ago. Cough and nasal drainage noted; hx of COPD. No fever at this time. Pt states nausea and vomiting; denies diarrhea. Pt states throat pain. Possible superficial anterior cervical lymph node palatable, left and right side. Right side notably larger than left. A&Ox4. O2 at 95 at this time.

## 2023-09-28 NOTE — ED Notes (Signed)
 [facility] called to transport pt home. ETA of 30 minutes from now.

## 2023-12-25 ENCOUNTER — Emergency Department (HOSPITAL_COMMUNITY)

## 2023-12-25 ENCOUNTER — Other Ambulatory Visit: Payer: Self-pay

## 2023-12-25 ENCOUNTER — Observation Stay (HOSPITAL_COMMUNITY)
Admission: EM | Admit: 2023-12-25 | Discharge: 2023-12-29 | DRG: 193 | Disposition: A | Attending: Internal Medicine | Admitting: Internal Medicine

## 2023-12-25 ENCOUNTER — Encounter (HOSPITAL_COMMUNITY): Payer: Self-pay | Admitting: Emergency Medicine

## 2023-12-25 DIAGNOSIS — J441 Chronic obstructive pulmonary disease with (acute) exacerbation: Principal | ICD-10-CM | POA: Diagnosis present

## 2023-12-25 DIAGNOSIS — J9601 Acute respiratory failure with hypoxia: Secondary | ICD-10-CM

## 2023-12-25 LAB — I-STAT CHEM 8, ED
BUN: 12 mg/dL (ref 8–23)
Calcium, Ion: 1.13 mmol/L — ABNORMAL LOW (ref 1.15–1.40)
Chloride: 107 mmol/L (ref 98–111)
Creatinine, Ser: 1.2 mg/dL (ref 0.61–1.24)
Glucose, Bld: 99 mg/dL (ref 70–99)
HCT: 37 % — ABNORMAL LOW (ref 39.0–52.0)
Hemoglobin: 12.6 g/dL — ABNORMAL LOW (ref 13.0–17.0)
Potassium: 3.6 mmol/L (ref 3.5–5.1)
Sodium: 145 mmol/L (ref 135–145)
TCO2: 25 mmol/L (ref 22–32)

## 2023-12-25 LAB — BASIC METABOLIC PANEL WITH GFR
Anion gap: 13 (ref 5–15)
BUN: 12 mg/dL (ref 8–23)
CO2: 23 mmol/L (ref 22–32)
Calcium: 8.4 mg/dL — ABNORMAL LOW (ref 8.9–10.3)
Chloride: 108 mmol/L (ref 98–111)
Creatinine, Ser: 0.97 mg/dL (ref 0.61–1.24)
GFR, Estimated: >60 mL/min (ref >60)
Glucose, Bld: 98 mg/dL (ref 70–99)
Potassium: 3.6 mmol/L (ref 3.5–5.1)
Sodium: 144 mmol/L (ref 135–145)

## 2023-12-25 LAB — RESP PANEL BY RT-PCR (RSV, FLU A&B, COVID)  RVPGX2
Influenza A by PCR: NEGATIVE
Influenza B by PCR: NEGATIVE
Resp Syncytial Virus by PCR: NEGATIVE
SARS Coronavirus 2 by RT PCR: NEGATIVE

## 2023-12-25 LAB — HEPATIC FUNCTION PANEL
ALT: 10 U/L (ref 0–44)
AST: 22 U/L (ref 15–41)
Albumin: 4 g/dL (ref 3.5–5.0)
Alkaline Phosphatase: 112 U/L (ref 38–126)
Bilirubin, Direct: 0.2 mg/dL (ref 0.0–0.2)
Indirect Bilirubin: 0.1 mg/dL — ABNORMAL LOW (ref 0.3–0.9)
Total Bilirubin: 0.3 mg/dL (ref 0.0–1.2)
Total Protein: 6.7 g/dL (ref 6.5–8.1)

## 2023-12-25 LAB — CBC WITH DIFFERENTIAL/PLATELET
Abs Immature Granulocytes: 0.01 K/uL (ref 0.00–0.07)
Basophils Absolute: 0 K/uL (ref 0.0–0.1)
Basophils Relative: 1 %
Eosinophils Absolute: 0.6 K/uL — ABNORMAL HIGH (ref 0.0–0.5)
Eosinophils Relative: 9 %
HCT: 38.4 % — ABNORMAL LOW (ref 39.0–52.0)
Hemoglobin: 12.1 g/dL — ABNORMAL LOW (ref 13.0–17.0)
Immature Granulocytes: 0 %
Lymphocytes Relative: 24 %
Lymphs Abs: 1.5 K/uL (ref 0.7–4.0)
MCH: 26.8 pg (ref 26.0–34.0)
MCHC: 31.5 g/dL (ref 30.0–36.0)
MCV: 85 fL (ref 80.0–100.0)
Monocytes Absolute: 0.8 K/uL (ref 0.1–1.0)
Monocytes Relative: 12 %
Neutro Abs: 3.4 K/uL (ref 1.7–7.7)
Neutrophils Relative %: 54 %
Platelets: 132 K/uL — ABNORMAL LOW (ref 150–400)
RBC: 4.52 MIL/uL (ref 4.22–5.81)
RDW: 15.9 % — ABNORMAL HIGH (ref 11.5–15.5)
WBC: 6.4 K/uL (ref 4.0–10.5)
nRBC: 0 % (ref 0.0–0.2)

## 2023-12-25 LAB — BLOOD GAS, VENOUS
Acid-Base Excess: 4.9 mmol/L — ABNORMAL HIGH (ref 0.0–2.0)
Bicarbonate: 31.4 mmol/L — ABNORMAL HIGH (ref 20.0–28.0)
Drawn by: 66297
O2 Saturation: 52.7 %
Patient temperature: 37.2
pCO2, Ven: 53 mmHg (ref 44–60)
pH, Ven: 7.38 (ref 7.25–7.43)
pO2, Ven: <31 mmHg — CL (ref 32–45)

## 2023-12-25 LAB — PRO BRAIN NATRIURETIC PEPTIDE: Pro Brain Natriuretic Peptide: 101 pg/mL (ref <300.0)

## 2023-12-25 LAB — MAGNESIUM: Magnesium: 2.4 mg/dL (ref 1.7–2.4)

## 2023-12-25 LAB — TROPONIN T, HIGH SENSITIVITY
Troponin T High Sensitivity: <15 ng/L (ref 0–19)
Troponin T High Sensitivity: <15 ng/L (ref 0–19)

## 2023-12-25 MED ORDER — IOHEXOL 350 MG/ML SOLN
75.0000 mL | Freq: Once | INTRAVENOUS | Status: AC | PRN
  Administered 2023-12-25: 75 mL via INTRAVENOUS

## 2023-12-25 MED ORDER — SODIUM CHLORIDE 0.9 % IV SOLN
2.0000 g | INTRAVENOUS | Status: DC
  Administered 2023-12-26 – 2023-12-28 (×3): 2 g via INTRAVENOUS
  Filled 2023-12-25 (×3): qty 20

## 2023-12-25 MED ORDER — KETOROLAC TROMETHAMINE 15 MG/ML IJ SOLN
15.0000 mg | Freq: Once | INTRAMUSCULAR | Status: AC
  Administered 2023-12-25: 15 mg via INTRAVENOUS
  Filled 2023-12-25: qty 1

## 2023-12-25 MED ORDER — PREDNISONE 10 MG PO TABS: 40.0000 mg | ORAL_TABLET | Freq: Every day | ORAL | 0 refills | Status: DC

## 2023-12-25 MED ORDER — HYDROMORPHONE HCL 1 MG/ML IJ SOLN
0.5000 mg | Freq: Once | INTRAMUSCULAR | Status: AC
  Administered 2023-12-25: 0.5 mg via INTRAVENOUS
  Filled 2023-12-25: qty 0.5

## 2023-12-25 MED ORDER — SODIUM CHLORIDE 0.9 % IV SOLN
500.0000 mg | INTRAVENOUS | Status: DC
  Administered 2023-12-26 – 2023-12-28 (×3): 500 mg via INTRAVENOUS
  Filled 2023-12-25 (×3): qty 5

## 2023-12-25 MED ORDER — IPRATROPIUM-ALBUTEROL 0.5-2.5 (3) MG/3ML IN SOLN
3.0000 mL | Freq: Four times a day (QID) | RESPIRATORY_TRACT | Status: AC
  Administered 2023-12-26 – 2023-12-28 (×8): 3 mL via RESPIRATORY_TRACT
  Filled 2023-12-25 (×7): qty 3

## 2023-12-25 MED ORDER — SODIUM CHLORIDE 0.9 % IV SOLN
500.0000 mg | Freq: Once | INTRAVENOUS | Status: AC
  Administered 2023-12-25: 500 mg via INTRAVENOUS
  Filled 2023-12-25: qty 5

## 2023-12-25 MED ORDER — AZITHROMYCIN 250 MG PO TABS: 250.0000 mg | ORAL_TABLET | Freq: Every day | ORAL | 0 refills | Status: DC

## 2023-12-25 MED ORDER — POLYETHYLENE GLYCOL 3350 17 G PO PACK: 17.0000 g | PACK | Freq: Every day | ORAL | Status: AC | PRN

## 2023-12-25 MED ORDER — LACTATED RINGERS IV SOLN: INTRAVENOUS | Status: DC

## 2023-12-25 MED ORDER — ACETAMINOPHEN 500 MG PO TABS
500.0000 mg | ORAL_TABLET | Freq: Four times a day (QID) | ORAL | Status: DC | PRN
  Administered 2023-12-26: 500 mg via ORAL
  Filled 2023-12-25: qty 1

## 2023-12-25 MED ORDER — MELATONIN 3 MG PO TABS
6.0000 mg | ORAL_TABLET | Freq: Every evening | ORAL | Status: AC | PRN
  Administered 2023-12-26 – 2023-12-29 (×2): 6 mg via ORAL
  Filled 2023-12-25 (×2): qty 2

## 2023-12-25 MED ORDER — PROCHLORPERAZINE EDISYLATE 10 MG/2ML IJ SOLN
5.0000 mg | Freq: Four times a day (QID) | INTRAMUSCULAR | Status: AC | PRN
  Filled 2023-12-25: qty 2

## 2023-12-25 MED ORDER — METHYLPREDNISOLONE SODIUM SUCC 40 MG IJ SOLR
40.0000 mg | Freq: Every day | INTRAMUSCULAR | Status: DC
  Administered 2023-12-26 (×2): 40 mg via INTRAVENOUS
  Filled 2023-12-25 (×2): qty 1

## 2023-12-25 MED ORDER — CEFPODOXIME PROXETIL 200 MG PO TABS: 200.0000 mg | ORAL_TABLET | Freq: Two times a day (BID) | ORAL | 0 refills | Status: DC

## 2023-12-25 MED ORDER — SODIUM CHLORIDE 0.9 % IV SOLN
1.0000 g | Freq: Once | INTRAVENOUS | Status: AC
  Administered 2023-12-25: 1 g via INTRAVENOUS
  Filled 2023-12-25: qty 10

## 2023-12-25 MED ORDER — ALBUTEROL SULFATE (2.5 MG/3ML) 0.083% IN NEBU
10.0000 mg/h | INHALATION_SOLUTION | Freq: Once | RESPIRATORY_TRACT | Status: AC
  Administered 2023-12-25: 10 mg/h via RESPIRATORY_TRACT
  Filled 2023-12-25: qty 12

## 2023-12-25 MED ORDER — ENOXAPARIN SODIUM 40 MG/0.4ML IJ SOSY
40.0000 mg | PREFILLED_SYRINGE | INTRAMUSCULAR | Status: DC
  Administered 2023-12-26 – 2023-12-29 (×4): 40 mg via SUBCUTANEOUS
  Filled 2023-12-25 (×3): qty 0.4

## 2023-12-25 NOTE — ED Notes (Signed)
 Pt was in 90 before walking., while walking his o2 dropped to 82 pt stated that he is SOB, brought pt back to the room and put his on 3 litter of oxygen which brought his o2 to 91.

## 2023-12-25 NOTE — ED Triage Notes (Addendum)
 Pt from home to ed rcems. C/o SOB with EMS stating: O2 sat at 92% RA with wheezing and tripod position. Pt c/o coughing up black mucus starting today. Pt took neb tx at home with no relief. Hx of COPD. Pt does not wear O2 at baseline  Ems gave  125 IV solumedrol Duoneb 18G L AC
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
## 2023-12-25 NOTE — ED Provider Notes (Signed)
 [facility] AT [facility] Provider Note   CSN: [id number] Arrival date & time: 12/25/23  8177     Patient presents with: Shortness of Breath   Jonathan Atkinson is a 73 y.o. male.    Shortness of Breath Associated symptoms: cough and wheezing   Patient presents for shortness of breath.  Medical history includes COPD.  Onset of symptoms was today.  He arrives today via EMS.  EMS noted wheezing, increased work of breathing, tripoding, and SpO2 in the low 90s on room air.  He was given DuoNeb and Solu-Medrol  with EMS.  Patient reports cough with dark sputum production.  Patient reports mild improvement with EMS breathing treatment.  He has some soreness in his upper back which he attributes to a fall that occurred 3 days ago.  He denies any other areas of discomfort.     Prior to Admission medications   Medication Sig Start Date End Date Taking? Authorizing Provider  albuterol  (VENTOLIN  HFA) 108 (90 Base) MCG/ACT inhaler Inhale 2 puffs into the lungs every 4 (four) hours as needed for shortness of breath or wheezing. 06/14/19  Yes [provider]  ipratropium-albuterol  (DUONEB) 0.5-2.5 (3) MG/3ML SOLN Take 3 mLs by nebulization every 6 (six) hours as needed. Patient taking differently: Take 3 mLs by nebulization every 4 (four) hours as needed (wheezing, shortness of breath). 10/09/22  Yes Small, Brooke L, PA    Allergies: Penicillins    Review of Systems  Constitutional:  Positive for fatigue.  Respiratory:  Positive for cough, shortness of breath and wheezing.   Musculoskeletal:  Positive for back pain.  All other systems reviewed and are negative.   Updated Vital Signs BP (!) 135/92   Pulse 92   Temp 98.9 F (37.2 C) (Oral)   Resp (!) 21   Ht 5' 11 (1.803 m)   Wt 68 kg   SpO2 98%   BMI 20.92 kg/m   Physical Exam Vitals and nursing note reviewed.  Constitutional:      General: He is not in acute distress.    Appearance: He is  well-developed. He is not ill-appearing, toxic-appearing or diaphoretic.  HENT:     Head: Normocephalic and atraumatic.     Mouth/Throat:     Mouth: Mucous membranes are moist.  Eyes:     Conjunctiva/sclera: Conjunctivae normal.  Cardiovascular:     Rate and Rhythm: Normal rate and regular rhythm.     Heart sounds: No murmur heard. Pulmonary:     Effort: Tachypnea, accessory muscle usage and respiratory distress present.     Breath sounds: Decreased breath sounds and wheezing present.  Chest:     Chest wall: No tenderness.  Abdominal:     Palpations: Abdomen is soft.     Tenderness: There is no abdominal tenderness.  Musculoskeletal:        General: No swelling. Normal range of motion.     Cervical back: Normal range of motion and neck supple.  Skin:    General: Skin is warm and dry.     Capillary Refill: Capillary refill takes less than 2 seconds.     Coloration: Skin is not cyanotic or pale.  Neurological:     General: No focal deficit present.     Mental Status: He is alert and oriented to person, place, and time.  Psychiatric:        Mood and Affect: Mood normal.        Behavior: Behavior normal.     (  all labs ordered are listed, but only abnormal results are displayed) Labs Reviewed  BASIC METABOLIC PANEL WITH GFR - Abnormal; Notable for the following components:      Result Value   Calcium 8.4 (*)    All other components within normal limits  HEPATIC FUNCTION PANEL - Abnormal; Notable for the following components:   Indirect Bilirubin 0.1 (*)    All other components within normal limits  BLOOD GAS, VENOUS - Abnormal; Notable for the following components:   pO2, Ven <31 (*)    Bicarbonate 31.4 (*)    Acid-Base Excess 4.9 (*)    All other components within normal limits  CBC WITH DIFFERENTIAL/PLATELET - Abnormal; Notable for the following components:   Hemoglobin 12.1 (*)    HCT 38.4 (*)    RDW 15.9 (*)    Platelets 132 (*)    Eosinophils Absolute 0.6 (*)     All other components within normal limits  I-STAT CHEM 8, ED - Abnormal; Notable for the following components:   Calcium, Ion 1.13 (*)    Hemoglobin 12.6 (*)    HCT 37.0 (*)    All other components within normal limits  RESP PANEL BY RT-PCR (RSV, FLU A&B, COVID)  RVPGX2  PRO BRAIN NATRIURETIC PEPTIDE  MAGNESIUM   TROPONIN T, HIGH SENSITIVITY  TROPONIN T, HIGH SENSITIVITY    EKG: EKG Interpretation Date/Time:  Monday December 25 2023 18:39:50 EST Ventricular Rate:  81 PR Interval:  147 QRS Duration:  77 QT Interval:  371 QTC Calculation: 431 R Axis:   80  Text Interpretation: Sinus rhythm Atrial premature complex Nonspecific T abnormalities, lateral leads Confirmed by Melvenia Motto [phone]) on 12/25/2023 7:14:27 PM  Radiology: CT T-SPINE NO CHARGE Result Date: 12/25/2023 EXAM: CT THORACIC SPINE WITH CONTRAST 12/25/2023 08:39:16 PM TECHNIQUE: CT of the thoracic spine was performed with the administration of 75 mL of iohexol  (OMNIPAQUE ) 350 MG/ML injection. Multiplanar reformatted images are provided for review. Automated exposure control, iterative reconstruction, and/or weight based adjustment of the mA/kV was utilized to reduce the radiation dose to as low as reasonably achievable. COMPARISON: None available. CLINICAL HISTORY: FINDINGS: BONES AND ALIGNMENT: Diffusely decreased bone density. Normal vertebral body heights. No acute fracture or suspicious bone lesion. Normal alignment. DEGENERATIVE CHANGES: No significant degenerative changes. SOFT TISSUES: No acute abnormality. IMPRESSION: 1. No acute abnormality of the thoracic spine. Electronically signed by: Morgane Naveau MD 12/25/2023 08:51 PM EST RP Workstation: [id number]   CT Angio Chest PE W and/or Wo Contrast Result Date: 12/25/2023 EXAM: CTA of the Chest with contrast for PE 12/25/2023 08:39:16 PM TECHNIQUE: CTA of the chest was performed after the administration of 75 mL of iohexol  (OMNIPAQUE ) 350 MG/ML injection. Multiplanar  reformatted images are provided for review. MIP images are provided for review. Automated exposure control, iterative reconstruction, and/or weight based adjustment of the mA/kV was utilized to reduce the radiation dose to as low as reasonably achievable. COMPARISON: None available. CLINICAL HISTORY: Pulmonary embolism (PE) suspected, high prob. FINDINGS: PULMONARY ARTERIES: Pulmonary arteries are adequately opacified for evaluation. No pulmonary embolism. Main pulmonary artery is normal in caliber. MEDIASTINUM: The heart and pericardium demonstrate no acute abnormality. There is no acute abnormality of the thoracic aorta. LYMPH NODES: No mediastinal, hilar or axillary lymphadenopathy. LUNGS AND PLEURA: Moderate centrilobular emphysematous changes. Peribronchial wall thickening. Bilateral mucous plugging. Right lower lobe 6 x 5 mm pulmonary nodule. No focal consolidation or pulmonary edema. No pleural effusion or pneumothorax. UPPER ABDOMEN: Limited images of the upper abdomen  are unremarkable. SOFT TISSUES AND BONES: No acute bone or soft tissue abnormality. IMPRESSION: 1. No pulmonary embolism. 2. Peribronchial wall thickening and bibasilar mucus plugging. 3. Right lower lobe solid pulmonary nodule measuring 6 x 5 mm; recommend non-contrast chest CT at 612 months, then consider additional non-contrast chest CT at 1824 months per Fleischner Society Guidelines, as patient risk is unspecified. 4. Emphysema; consider evaluation for low-dose CT lung cancer screening program, given independent lung cancer risk in eligible patients. Electronically signed by: Morgane Naveau MD 12/25/2023 08:49 PM EST RP Workstation: [id number]   DG Chest Port 1 View Result Date: 12/25/2023 EXAM: 1 VIEW(S) XRAY OF THE CHEST 12/25/2023 06:50:10 PM COMPARISON: 09/28/2023 CLINICAL HISTORY: dyspnea FINDINGS: LUNGS AND PLEURA: Nodular opacity in right mid lung, likely nipple shadow. Chronic coarsened interstitial markings with  hyperinflation of the lungs. No pleural effusion. No pneumothorax. HEART AND MEDIASTINUM: Aortic atherosclerosis. No acute abnormality of the cardiac and mediastinal silhouettes. BONES AND SOFT TISSUES: No acute osseous abnormality. IMPRESSION: 1. No acute cardiopulmonary process identified. 2. Nodular right mid-lung opacity is favored to represent a nipple shadow; recommend repeat radiograph with nipple markers to confirm. Electronically signed by: Morgane Naveau MD 12/25/2023 07:07 PM EST RP Workstation: [id number]     Procedures   Medications Ordered in the ED  albuterol  (PROVENTIL ) (2.5 MG/3ML) 0.083% nebulizer solution (10 mg/hr Nebulization Given 12/25/23 1851)  cefTRIAXone  (ROCEPHIN ) 1 g in sodium chloride  0.9 % 100 mL IVPB (0 g Intravenous Stopped 12/25/23 2018)  azithromycin  (ZITHROMAX ) 500 mg in sodium chloride  0.9 % 250 mL IVPB (0 mg Intravenous Stopped 12/25/23 2130)  HYDROmorphone  (DILAUDID ) injection 0.5 mg (0.5 mg Intravenous Given 12/25/23 1940)  ketorolac  (TORADOL ) 15 MG/ML injection 15 mg (15 mg Intravenous Given 12/25/23 1941)  iohexol  (OMNIPAQUE ) 350 MG/ML injection 75 mL (75 mLs Intravenous Contrast Given 12/25/23 2027)                                    Medical Decision Making Amount and/or Complexity of Data Reviewed Labs: ordered. Radiology: ordered.  Risk Prescription drug management.   This patient presents to the ED for concern of shortness of breath, this involves an extensive number of treatment options, and is a complaint that carries with it a high risk of complications and morbidity.  The differential diagnosis includes COPD exacerbation, pneumonia, CHF, URI, allergic reaction, PE   Co morbidities / Chronic conditions that complicate the patient evaluation  COPD   Additional history obtained:  Additional history obtained from EMR External records from outside source obtained and reviewed including N/A   Lab Tests:  I Ordered, and personally  interpreted labs.  The pertinent results include: Normal hemoglobin, no leukocytosis, normal kidney function, normal electrolytes, compensated mild hypercarbia on blood gas, normal troponin, normal BNP   Imaging Studies ordered:  I ordered imaging studies including chest x-ray, CT chest, CT of thoracic spine I independently visualized and interpreted imaging which showed no acute findings I agree with the radiologist interpretation   Cardiac Monitoring: / EKG:  The patient was maintained on a cardiac monitor.  I personally viewed and interpreted the cardiac monitored which showed an underlying rhythm of: Sinus rhythm   Problem List / ED Course / Critical interventions / Medication management  Patient presenting for respiratory distress.  He has a history of COPD and had onset of shortness of breath, chest tightness, and cough productive of dark sputum today.  EMS was called to his home tonight.  He did receive Solu-Medrol  and DuoNeb breathing treatment prior to arrival.  On arrival, breathing treatment is ongoing.  He does report mild improvement.  Despite this, he has continued tachypnea, increased work of breathing and is unable to speak in complete sentences.  He has wheezing and diminished breath sounds on lung auscultation.  Continue albuterol  as ordered.  Workup was initiated.  Initial lab work was unremarkable.  Given his dark sputum production, antibiotics were ordered.  On continuous albuterol , patient had further improvement in symptoms.  He is now able to speak in complete sentences.  His work of breathing has improved.  He does have ongoing upper back pain from his recent fall.  He describes the fall as walking down a ramp and slipping on ice causing him to fall and land flat on his back.  Will obtain imaging to assess for acute injury.  Imaging studies did not show any acute findings.  Following breathing treatment, patient had sustained improvement in his shortness of breath.  He does  continue to have expiratory wheeze on lung auscultation.  He does not wish to stay in the hospital tonight.  However, on room air SpO2 is in the 80s.  He was placed on 3 L of supplemental oxygen.  Given his acute hypoxia, patient is agreeable for admission. I ordered medication including albuterol  for COPD exacerbation; ceftriaxone  and azithromycin  for increased sputum production; Dilaudid  and Toradol  for analgesia Reevaluation of the patient after these medicines showed that the patient improved I have reviewed the patients home medicines and have made adjustments as needed  Social Determinants of Health:  Lives independently  CRITICAL CARE Performed by: Bernardino Fireman   Total critical care time: 32 minutes  Critical care time was exclusive of separately billable procedures and treating other patients.  Critical care was necessary to treat or prevent imminent or life-threatening deterioration.  Critical care was time spent personally by me on the following activities: development of treatment plan with patient and/or surrogate as well as nursing, discussions with consultants, evaluation of patient's response to treatment, examination of patient, obtaining history from patient or surrogate, ordering and performing treatments and interventions, ordering and review of laboratory studies, ordering and review of radiographic studies, pulse oximetry and re-evaluation of patient's condition.     Final diagnoses:  COPD exacerbation (HCC)  Acute respiratory failure with hypoxia [facility])    ED Discharge Orders          Ordered    azithromycin  (ZITHROMAX ) 250 MG tablet  Daily,   Status:  Discontinued        12/25/23 2126    cefpodoxime  (VANTIN ) 200 MG tablet  2 times daily,   Status:  Discontinued        12/25/23 2126    predniSONE  (DELTASONE ) 10 MG tablet  Daily,   Status:  Discontinued        12/25/23 2126               Fireman Bernardino, MD 12/25/23 2215

## 2023-12-25 NOTE — ED Notes (Signed)
 X-ray at bedside.

## 2023-12-25 NOTE — ED Notes (Signed)
 Patient transported to CT

## 2023-12-25 NOTE — ED Notes (Signed)
 RT at bedside.

## 2023-12-26 DIAGNOSIS — J189 Pneumonia, unspecified organism: Secondary | ICD-10-CM | POA: Diagnosis present

## 2023-12-26 DIAGNOSIS — Y9301 Activity, walking, marching and hiking: Secondary | ICD-10-CM | POA: Diagnosis present

## 2023-12-26 DIAGNOSIS — J441 Chronic obstructive pulmonary disease with (acute) exacerbation: Secondary | ICD-10-CM | POA: Diagnosis present

## 2023-12-26 DIAGNOSIS — R5381 Other malaise: Secondary | ICD-10-CM | POA: Diagnosis present

## 2023-12-26 DIAGNOSIS — F17210 Nicotine dependence, cigarettes, uncomplicated: Secondary | ICD-10-CM | POA: Diagnosis present

## 2023-12-26 DIAGNOSIS — W000XXA Fall on same level due to ice and snow, initial encounter: Secondary | ICD-10-CM | POA: Diagnosis present

## 2023-12-26 DIAGNOSIS — R911 Solitary pulmonary nodule: Secondary | ICD-10-CM | POA: Diagnosis present

## 2023-12-26 DIAGNOSIS — Z880 Allergy status to penicillin: Secondary | ICD-10-CM | POA: Diagnosis not present

## 2023-12-26 DIAGNOSIS — J9602 Acute respiratory failure with hypercapnia: Secondary | ICD-10-CM | POA: Diagnosis present

## 2023-12-26 DIAGNOSIS — F1490 Cocaine use, unspecified, uncomplicated: Secondary | ICD-10-CM | POA: Diagnosis present

## 2023-12-26 DIAGNOSIS — J9601 Acute respiratory failure with hypoxia: Secondary | ICD-10-CM | POA: Diagnosis present

## 2023-12-26 DIAGNOSIS — E860 Dehydration: Secondary | ICD-10-CM | POA: Diagnosis present

## 2023-12-26 DIAGNOSIS — Z1152 Encounter for screening for COVID-19: Secondary | ICD-10-CM | POA: Diagnosis not present

## 2023-12-26 LAB — CBC WITH DIFFERENTIAL/PLATELET
Abs Immature Granulocytes: 0.02 K/uL (ref 0.00–0.07)
Basophils Absolute: 0 K/uL (ref 0.0–0.1)
Basophils Relative: 0 %
Eosinophils Absolute: 0 K/uL (ref 0.0–0.5)
Eosinophils Relative: 0 %
HCT: 39.1 % (ref 39.0–52.0)
Hemoglobin: 12.2 g/dL — ABNORMAL LOW (ref 13.0–17.0)
Immature Granulocytes: 0 %
Lymphocytes Relative: 5 %
Lymphs Abs: 0.3 K/uL — ABNORMAL LOW (ref 0.7–4.0)
MCH: 26.9 pg (ref 26.0–34.0)
MCHC: 31.2 g/dL (ref 30.0–36.0)
MCV: 86.1 fL (ref 80.0–100.0)
Monocytes Absolute: 0 K/uL — ABNORMAL LOW (ref 0.1–1.0)
Monocytes Relative: 1 %
Neutro Abs: 4.6 K/uL (ref 1.7–7.7)
Neutrophils Relative %: 94 %
Platelets: 135 K/uL — ABNORMAL LOW (ref 150–400)
RBC: 4.54 MIL/uL (ref 4.22–5.81)
RDW: 16.3 % — ABNORMAL HIGH (ref 11.5–15.5)
WBC: 4.9 K/uL (ref 4.0–10.5)
nRBC: 0 % (ref 0.0–0.2)

## 2023-12-26 LAB — BASIC METABOLIC PANEL WITH GFR
Anion gap: 13 (ref 5–15)
BUN: 14 mg/dL (ref 8–23)
CO2: 24 mmol/L (ref 22–32)
Calcium: 8.5 mg/dL — ABNORMAL LOW (ref 8.9–10.3)
Chloride: 105 mmol/L (ref 98–111)
Creatinine, Ser: 0.91 mg/dL (ref 0.61–1.24)
GFR, Estimated: >60 mL/min (ref >60)
Glucose, Bld: 125 mg/dL — ABNORMAL HIGH (ref 70–99)
Potassium: 4.4 mmol/L (ref 3.5–5.1)
Sodium: 142 mmol/L (ref 135–145)

## 2023-12-26 LAB — PHOSPHORUS: Phosphorus: 3.4 mg/dL (ref 2.5–4.6)

## 2023-12-26 LAB — MAGNESIUM: Magnesium: 2.3 mg/dL (ref 1.7–2.4)

## 2023-12-26 MED ORDER — PANTOPRAZOLE SODIUM 40 MG PO TBEC
40.0000 mg | DELAYED_RELEASE_TABLET | Freq: Every evening | ORAL | Status: DC
  Administered 2023-12-26 – 2023-12-28 (×3): 40 mg via ORAL
  Filled 2023-12-26 (×3): qty 1

## 2023-12-26 MED ORDER — KETOROLAC TROMETHAMINE 30 MG/ML IJ SOLN
30.0000 mg | Freq: Three times a day (TID) | INTRAMUSCULAR | Status: DC | PRN
  Administered 2023-12-26 – 2023-12-29 (×5): 30 mg via INTRAVENOUS
  Filled 2023-12-26 (×5): qty 1

## 2023-12-26 MED ORDER — ALBUTEROL SULFATE (2.5 MG/3ML) 0.083% IN NEBU: 2.5000 mg | INHALATION_SOLUTION | RESPIRATORY_TRACT | Status: DC | PRN

## 2023-12-26 MED ORDER — METHYLPREDNISOLONE SODIUM SUCC 40 MG IJ SOLR
40.0000 mg | Freq: Two times a day (BID) | INTRAMUSCULAR | Status: DC
  Administered 2023-12-26 – 2023-12-29 (×6): 40 mg via INTRAVENOUS
  Filled 2023-12-26 (×6): qty 1

## 2023-12-26 MED ORDER — GUAIFENESIN-DM 100-10 MG/5ML PO SYRP: 5.0000 mL | ORAL_SOLUTION | ORAL | Status: DC | PRN

## 2023-12-26 MED ORDER — IPRATROPIUM-ALBUTEROL 0.5-2.5 (3) MG/3ML IN SOLN
3.0000 mL | RESPIRATORY_TRACT | Status: DC | PRN
  Administered 2023-12-26 (×2): 3 mL via RESPIRATORY_TRACT
  Filled 2023-12-26 (×3): qty 3

## 2023-12-26 MED ORDER — LIDOCAINE 5 % EX PTCH
1.0000 | MEDICATED_PATCH | CUTANEOUS | Status: DC
  Administered 2023-12-26 – 2023-12-28 (×3): 1 via TRANSDERMAL
  Filled 2023-12-26 (×3): qty 1

## 2023-12-26 MED ORDER — GUAIFENESIN ER 600 MG PO TB12
600.0000 mg | ORAL_TABLET | Freq: Two times a day (BID) | ORAL | Status: DC
  Administered 2023-12-26 – 2023-12-29 (×6): 600 mg via ORAL
  Filled 2023-12-26 (×6): qty 1

## 2023-12-26 NOTE — Plan of Care (Signed)
   Problem: Activity: Goal: Risk for activity intolerance will decrease Outcome: Progressing   Problem: Coping: Goal: Level of anxiety will decrease Outcome: Progressing

## 2023-12-26 NOTE — Progress Notes (Signed)
   12/26/23 1040  TOC Brief Assessment  Insurance and Status Reviewed  Patient has primary care physician Yes  Home environment has been reviewed Home alone  Prior level of function: Indpendent  Prior/Current Home Services No current home services  Social Drivers of Health Review SDOH reviewed no interventions necessary  Readmission risk has been reviewed Yes  Transition of care needs no transition of care needs at this time   Inpatient Care Manager (ICM) has reviewed patient and no ICM needs have been identified at this time. We will continue to monitor patient advancement through interdisciplinary progression rounds. If new patient transition needs arise, please place a ICM consult.

## 2023-12-26 NOTE — Plan of Care (Signed)
   Problem: Education: Goal: Knowledge of General Education information will improve Description: Including pain rating scale, medication(s)/side effects and non-pharmacologic comfort measures Outcome: Progressing   Problem: Nutrition: Goal: Adequate nutrition will be maintained Outcome: Progressing

## 2023-12-26 NOTE — Progress Notes (Signed)
 Pt took self to restroom once again. Became more short of breath. He is back to bed sitting on edge in tripod position. Respiratory was called to evaluate patient. Kellogg RN

## 2023-12-26 NOTE — Progress Notes (Signed)
 Pt continues to be short of breath though out day and is exacerbated by any movement especially ambulation. He has bee instructed many times to press call button, use urinal, and not to get up alone.  Bed alarm is set. Kellogg RN

## 2023-12-26 NOTE — Hospital Course (Signed)
 73 y.o. male with medical history significant for COPD, former smoker, relapsed last week after quitting tobacco use for about 3 months.  Endorses increased shortness of breath, cough, and sputum production.  Denies any subjective fevers, however admits to chills.  Symptoms started on Friday after he tripped and fell while helping out moving objects outside the [employer] he attends.  He presents to the ER today for further evaluation.   In the ER, diffuse wheezing noted on exam, tachypneic, and hypoxic with ambulation.  O2 saturation of 83% on room air while walking.  Chest x-ray revealed no acute cardiopulmonary process identified.   Due to concern for acute exacerbation of COPD and possible early community-acquired pneumonia, the patient was started on IV azithromycin , Rocephin , IV Solu-Medrol , and bronchodilator nebulizers.  Admitted by [facility], hospitalist service.   ED Course: Temperature 98.6.  BP 146/83, pulse 62, respiratory 18, O2 saturation 95% on 4 L.

## 2023-12-26 NOTE — Progress Notes (Signed)
 PROGRESS NOTE   Jonathan Atkinson  FMW:[id number] DOB: Dec 30, 1950 DOA: 12/25/2023 PCP: Maree Leni Edyth [name], MD   Chief Complaint  Patient presents with   Shortness of Breath   Level of care: Telemetry  Brief Admission History:  73 y.o. male with medical history significant for COPD, former smoker, relapsed last week after quitting tobacco use for about 3 months.  Endorses increased shortness of breath, cough, and sputum production.  Denies any subjective fevers, however admits to chills.  Symptoms started on Friday after he tripped and fell while helping out moving objects outside the [employer] he attends.  He presents to the ER today for further evaluation.   In the ER, diffuse wheezing noted on exam, tachypneic, and hypoxic with ambulation.  O2 saturation of 83% on room air while walking.  Chest x-ray revealed no acute cardiopulmonary process identified.   Due to concern for acute exacerbation of COPD and possible early community-acquired pneumonia, the patient was started on IV azithromycin , Rocephin , IV Solu-Medrol , and bronchodilator nebulizers.  Admitted by [facility], hospitalist service.   ED Course: Temperature 98.6.  BP 146/83, pulse 62, respiratory 18, O2 saturation 95% on 4 L.     Assessment and Plan:  Acute exacerbation of COPD possibly from recent tobacco use and concern for early CAP, POA Former smoker, relapsed last week after quitting smoking for 3 months. Declined nicotine patch. Encouraged to continue to avoid smoking.  The patient was receptive. Continue treatment with IV Solu-Medrol , bronchodilators, antibiotics As needed antitussives Incentive spirometer Early mobilization.   Acute hypoxic respiratory failure secondary to the above Not on oxygen supplementation at baseline O2 saturation in the 80s on room air. Home O2 evaluation closer to discharge  CT angio chest was negative for pulmonary embolism   Tobacco user Recently relapsed continue tobacco cessation. Declined  nicotine patch.    Mild dehydration from poor oral intake Briefly treated with IV fluid but now eating and drinking better  Back pain -- asking for dilaudid  specifically -- explained to patient and RN that dilaudid  is not where we start treatment -- recommended acetaminophen , toradol , lidocaine  patch, heating pad  -- requested a urine toxicology to screen for recreational substances  DVT prophylaxis: enoxaparin  Code Status: Full  Family Communication:  Disposition: home    Consultants:   Procedures:   Antimicrobials:  Ceftriaxone  12/8>> Azithromycin  12/8>>   Subjective: Pt reports ongoing shortness of breath, cough, chest congestion and malaise.   Objective: Vitals:   12/26/23 0719 12/26/23 0939 12/26/23 1058 12/26/23 1309  BP:    116/74  Pulse:    87  Resp:      Temp:    99.1 F (37.3 C)  TempSrc:    Oral  SpO2: 91% 96% 94% 97%  Weight:      Height:        Intake/Output Summary (Last 24 hours) at 12/26/2023 1552 Last data filed at 12/26/2023 0526 Gross per 24 hour  Intake 802.68 ml  Output --  Net 802.68 ml   Filed Weights   12/25/23 1833 12/25/23 2310  Weight: 68 kg 73 kg   Examination:  General exam: Appears calm and comfortable  Respiratory system: diffuse bilateral wheezing and rales.  Cardiovascular system: normal S1 & S2 heard. No JVD, murmurs, rubs, gallops or clicks. No pedal edema. Gastrointestinal system: Abdomen is nondistended, soft and nontender. No organomegaly or masses felt. Normal bowel sounds heard. Central nervous system: Alert and oriented. No focal neurological deficits. Extremities: Symmetric 5 x 5 power.  Skin: No rashes, lesions or ulcers. Psychiatry: Judgement and insight appear normal. Mood & affect appropriate.   Data Reviewed: I have personally reviewed following labs and imaging studies  CBC: Recent Labs  Lab 12/25/23 1853 12/25/23 1903 12/26/23 0412  WBC 6.4  --  4.9  NEUTROABS 3.4  --  4.6  HGB 12.1* 12.6* 12.2*   HCT 38.4* 37.0* 39.1  MCV 85.0  --  86.1  PLT 132*  --  135*    Basic Metabolic Panel: Recent Labs  Lab 12/25/23 1853 12/25/23 1903 12/26/23 0412  NA 144 145 142  K 3.6 3.6 4.4  CL 108 107 105  CO2 23  --  24  GLUCOSE 98 99 125*  BUN 12 12 14   CREATININE 0.97 1.20 0.91  CALCIUM 8.4*  --  8.5*  MG 2.4  --  2.3  PHOS  --   --  3.4    CBG: No results for input(s): GLUCAP in the last 168 hours.  Recent Results (from the past 240 hours)  Resp panel by RT-PCR (RSV, Flu A&B, Covid) Anterior Nasal Swab     Status: None   Collection Time: 12/25/23  6:57 PM   Specimen: Anterior Nasal Swab  Result Value Ref Range Status   SARS Coronavirus 2 by RT PCR NEGATIVE NEGATIVE Final    Comment: (NOTE) SARS-CoV-2 target nucleic acids are NOT DETECTED.  The SARS-CoV-2 RNA is generally detectable in upper respiratory specimens during the acute phase of infection. The lowest concentration of SARS-CoV-2 viral copies this assay can detect is 138 copies/mL. A negative result does not preclude SARS-Cov-2 infection and should not be used as the sole basis for treatment or other patient management decisions. A negative result may occur with  improper specimen collection/handling, submission of specimen other than nasopharyngeal swab, presence of viral mutation(s) within the areas targeted by this assay, and inadequate number of viral copies(<138 copies/mL). A negative result must be combined with clinical observations, patient history, and epidemiological information. The expected result is Negative.  Fact Sheet for Patients:  bloggercourse.com  Fact Sheet for Healthcare Providers:  seriousbroker.it  This test is no t yet approved or cleared by the [place]  FDA and  has been authorized for detection and/or diagnosis of SARS-CoV-2 by FDA under an Emergency Use Authorization (EUA). This EUA will remain  in effect (meaning this test  can be used) for the duration of the COVID-19 declaration under Section 564(b)(1) of the Act, 21 U.S.C.section 360bbb-3(b)(1), unless the authorization is terminated  or revoked sooner.       Influenza A by PCR NEGATIVE NEGATIVE Final   Influenza B by PCR NEGATIVE NEGATIVE Final    Comment: (NOTE) The Xpert Xpress SARS-CoV-2/FLU/RSV plus assay is intended as an aid in the diagnosis of influenza from Nasopharyngeal swab specimens and should not be used as a sole basis for treatment. Nasal washings and aspirates are unacceptable for Xpert Xpress SARS-CoV-2/FLU/RSV testing.  Fact Sheet for Patients: bloggercourse.com  Fact Sheet for Healthcare Providers: seriousbroker.it  This test is not yet approved or cleared by the [place]  FDA and has been authorized for detection and/or diagnosis of SARS-CoV-2 by FDA under an Emergency Use Authorization (EUA). This EUA will remain in effect (meaning this test can be used) for the duration of the COVID-19 declaration under Section 564(b)(1) of the Act, 21 U.S.C. section 360bbb-3(b)(1), unless the authorization is terminated or revoked.     Resp Syncytial Virus by PCR NEGATIVE NEGATIVE Final  Comment: (NOTE) Fact Sheet for Patients: bloggercourse.com  Fact Sheet for Healthcare Providers: seriousbroker.it  This test is not yet approved or cleared by the [place]  FDA and has been authorized for detection and/or diagnosis of SARS-CoV-2 by FDA under an Emergency Use Authorization (EUA). This EUA will remain in effect (meaning this test can be used) for the duration of the COVID-19 declaration under Section 564(b)(1) of the Act, 21 U.S.C. section 360bbb-3(b)(1), unless the authorization is terminated or revoked.  Performed at [facility], [address] [place], [place] [address]      Radiology Studies: CT T-SPINE NO  CHARGE Result Date: 12/25/2023 EXAM: CT THORACIC SPINE WITH CONTRAST 12/25/2023 08:39:16 PM TECHNIQUE: CT of the thoracic spine was performed with the administration of 75 mL of iohexol  (OMNIPAQUE ) 350 MG/ML injection. Multiplanar reformatted images are provided for review. Automated exposure control, iterative reconstruction, and/or weight based adjustment of the mA/kV was utilized to reduce the radiation dose to as low as reasonably achievable. COMPARISON: None available. CLINICAL HISTORY: FINDINGS: BONES AND ALIGNMENT: Diffusely decreased bone density. Normal vertebral body heights. No acute fracture or suspicious bone lesion. Normal alignment. DEGENERATIVE CHANGES: No significant degenerative changes. SOFT TISSUES: No acute abnormality. IMPRESSION: 1. No acute abnormality of the thoracic spine. Electronically signed by: Morgane Naveau MD 12/25/2023 08:51 PM EST RP Workstation: [id number]   CT Angio Chest PE W and/or Wo Contrast Result Date: 12/25/2023 EXAM: CTA of the Chest with contrast for PE 12/25/2023 08:39:16 PM TECHNIQUE: CTA of the chest was performed after the administration of 75 mL of iohexol  (OMNIPAQUE ) 350 MG/ML injection. Multiplanar reformatted images are provided for review. MIP images are provided for review. Automated exposure control, iterative reconstruction, and/or weight based adjustment of the mA/kV was utilized to reduce the radiation dose to as low as reasonably achievable. COMPARISON: None available. CLINICAL HISTORY: Pulmonary embolism (PE) suspected, high prob. FINDINGS: PULMONARY ARTERIES: Pulmonary arteries are adequately opacified for evaluation. No pulmonary embolism. Main pulmonary artery is normal in caliber. MEDIASTINUM: The heart and pericardium demonstrate no acute abnormality. There is no acute abnormality of the thoracic aorta. LYMPH NODES: No mediastinal, hilar or axillary lymphadenopathy. LUNGS AND PLEURA: Moderate centrilobular emphysematous changes. Peribronchial  wall thickening. Bilateral mucous plugging. Right lower lobe 6 x 5 mm pulmonary nodule. No focal consolidation or pulmonary edema. No pleural effusion or pneumothorax. UPPER ABDOMEN: Limited images of the upper abdomen are unremarkable. SOFT TISSUES AND BONES: No acute bone or soft tissue abnormality. IMPRESSION: 1. No pulmonary embolism. 2. Peribronchial wall thickening and bibasilar mucus plugging. 3. Right lower lobe solid pulmonary nodule measuring 6 x 5 mm; recommend non-contrast chest CT at 612 months, then consider additional non-contrast chest CT at 1824 months per Fleischner Society Guidelines, as patient risk is unspecified. 4. Emphysema; consider evaluation for low-dose CT lung cancer screening program, given independent lung cancer risk in eligible patients. Electronically signed by: Morgane Naveau MD 12/25/2023 08:49 PM EST RP Workstation: [id number]   DG Chest Port 1 View Result Date: 12/25/2023 EXAM: 1 VIEW(S) XRAY OF THE CHEST 12/25/2023 06:50:10 PM COMPARISON: 09/28/2023 CLINICAL HISTORY: dyspnea FINDINGS: LUNGS AND PLEURA: Nodular opacity in right mid lung, likely nipple shadow. Chronic coarsened interstitial markings with hyperinflation of the lungs. No pleural effusion. No pneumothorax. HEART AND MEDIASTINUM: Aortic atherosclerosis. No acute abnormality of the cardiac and mediastinal silhouettes. BONES AND SOFT TISSUES: No acute osseous abnormality. IMPRESSION: 1. No acute cardiopulmonary process identified. 2. Nodular right mid-lung opacity is favored to represent a nipple shadow; recommend repeat  radiograph with nipple markers to confirm. Electronically signed by: Morgane Naveau MD 12/25/2023 07:07 PM EST RP Workstation: [id number]    Scheduled Meds:  enoxaparin  (LOVENOX ) injection  40 mg Subcutaneous Q24H   ipratropium-albuterol   3 mL Nebulization Q6H   lidocaine   1 patch Transdermal Q24H   methylPREDNISolone  (SOLU-MEDROL ) injection  40 mg Intravenous Daily   Continuous  Infusions:  azithromycin  500 mg (12/26/23 1517)   cefTRIAXone  (ROCEPHIN )  IV 2 g (12/26/23 1426)   lactated ringers  30 mL/hr at 12/26/23 0939    LOS: 0 days   Time spent: 53 mins  Emmalia Heyboer Vicci, MD How to contact the [facility] Attending or Consulting provider 7A - 7P or covering provider during after hours 7P -7A, for this patient?  Check the care team in [facility] and look for a) attending/consulting [facility] provider listed and b) the [facility] team listed Log into [email or web address] to find provider on call.  Locate the [facility] provider you are looking for under [facility] and page to a number that you can be directly reached. If you still have difficulty reaching the provider, please page the [facility] (Director on Call) for the Hospitalists listed on amion for assistance.  12/26/2023, 3:52 PM

## 2023-12-26 NOTE — Progress Notes (Signed)
 Pt took self to restroom and  called out for help when he became short of breath. Upon this RN's arrival patient is in tripod position. SPO2  dipped in to low 80's despite 2 L [place].  Wheezing in all lung fields.  Duoneb administered by this RN. SPO2 up to 98%. Pt was instructed by this RN not to get up and to please use provided urinal. Bed alarm set. [place] RN

## 2023-12-27 DIAGNOSIS — F1490 Cocaine use, unspecified, uncomplicated: Secondary | ICD-10-CM

## 2023-12-27 DIAGNOSIS — J9601 Acute respiratory failure with hypoxia: Secondary | ICD-10-CM

## 2023-12-27 LAB — URINE DRUG SCREEN
Amphetamines: NEGATIVE
Barbiturates: NEGATIVE
Benzodiazepines: NEGATIVE
Cocaine: POSITIVE — AB
Fentanyl: NEGATIVE
Methadone Scn, Ur: NEGATIVE
Opiates: NEGATIVE
Tetrahydrocannabinol: NEGATIVE

## 2023-12-27 MED ORDER — BUDESONIDE 0.5 MG/2ML IN SUSP
0.5000 mg | Freq: Two times a day (BID) | RESPIRATORY_TRACT | Status: DC
  Administered 2023-12-27 – 2023-12-29 (×4): 0.5 mg via RESPIRATORY_TRACT
  Filled 2023-12-27 (×4): qty 2

## 2023-12-27 MED ORDER — REVEFENACIN 175 MCG/3ML IN SOLN
175.0000 ug | Freq: Every day | RESPIRATORY_TRACT | Status: DC
  Administered 2023-12-28 – 2023-12-29 (×2): 175 ug via RESPIRATORY_TRACT
  Filled 2023-12-27 (×2): qty 3

## 2023-12-27 MED ORDER — ARFORMOTEROL TARTRATE 15 MCG/2ML IN NEBU
15.0000 ug | INHALATION_SOLUTION | Freq: Two times a day (BID) | RESPIRATORY_TRACT | Status: DC
  Administered 2023-12-27 – 2023-12-29 (×4): 15 ug via RESPIRATORY_TRACT
  Filled 2023-12-27 (×4): qty 2

## 2023-12-27 MED ORDER — ALBUTEROL SULFATE (2.5 MG/3ML) 0.083% IN NEBU
2.5000 mg | INHALATION_SOLUTION | Freq: Four times a day (QID) | RESPIRATORY_TRACT | Status: DC
  Administered 2023-12-28 – 2023-12-29 (×5): 2.5 mg via RESPIRATORY_TRACT
  Filled 2023-12-27 (×6): qty 3

## 2023-12-27 NOTE — Progress Notes (Signed)
 PROGRESS NOTE  Jonathan Atkinson FMW:[id number] DOB: [birth year] DOA: 12/25/2023 PCP: Maree Leni Edyth [name], MD  Brief History:  73 y.o. male with medical history significant for COPD, former smoker, relapsed last week after quitting tobacco use for about 3 months.  Endorses increased shortness of breath, cough, and sputum production.  Denies any subjective fevers, however admits to chills.  Symptoms started on Friday after he tripped and fell while helping out moving objects outside the [employer] he attends.  He presents to the ER today for further evaluation.   In the ER, diffuse wheezing noted on exam, tachypneic, and hypoxic with ambulation.  O2 saturation of 83% on room air while walking.  Chest x-ray revealed no acute cardiopulmonary process identified.   Due to concern for acute exacerbation of COPD and possible early community-acquired pneumonia, the patient was started on IV azithromycin , Rocephin , IV Solu-Medrol , and bronchodilator nebulizers.  Admitted by [facility], hospitalist service.   ED Course: Temperature 98.6.  BP 146/83, pulse 62, respiratory 18, O2 saturation 95% on 4 L.     Assessment/Plan:  Acute exacerbation of COPD  Former smoker, relapsed last week after quitting smoking for 3 months. Declined nicotine patch. -tobacco cessation discussed -continue IV solumedrol -add brovana  -add pulmicort  -start yupelri   Acute hypoxic respiratory failure with hypercarbia Not on oxygen supplementation at baseline O2 saturation in the 80s on room air. 12/8 CTA chest neg for PE; peribronchial wall thickening and mucus plugging -now stable on 3L -wean oxygen to RA   Tobacco user Recently relapsed continue tobacco cessation.  -Declined nicotine patch.     Back pain -- asking for dilaudid  specifically -- explained to patient and RN that dilaudid  is not where we start treatment -- recommended acetaminophen , toradol , lidocaine  patch, heating pad  -- requested a urine toxicology  to screen for recreational substances - UDS positive for cocaine  Cocaine use -cessation discussed  Lung nodule -RLL 6x5 mm nodule noted on CTA chest -outpatient surveillance    Family Communication:   no Family at bedside  Consultants:  none  Code Status:  FULL   DVT Prophylaxis:  [place] Heparin / [place] Lovenox    Procedures: As Listed in Progress Note Above  Antibiotics: Ceftriaxone  12/8>> Azithro 12/8>>      Subjective: Patient states that he is breathing a little better but continues to have shortness of breath with exertion.  He has a dry cough.  He denies hemoptysis.  He denies any nausea, vomiting, diarrhea, abdominal pain.  He denies any chest pain, fevers, chills.  Objective: Vitals:   12/27/23 0450 12/27/23 0703 12/27/23 1241 12/27/23 1256  BP: 117/78   122/63  Pulse: 64   76  Resp: 20   20  Temp: 98.4 F (36.9 C)   97.9 F (36.6 C)  TempSrc:    Oral  SpO2: 99% 99% 99% 100%  Weight:      Height:        Intake/Output Summary (Last 24 hours) at 12/27/2023 1823 Last data filed at 12/27/2023 1523 Gross per 24 hour  Intake 720 ml  Output 800 ml  Net -80 ml   Weight change:  Exam:  General:  Pt is alert, follows commands appropriately, not in acute distress HEENT: No icterus, No thrush, No neck mass, [place]/AT Cardiovascular: RRR, S1/S2, no rubs, no gallops Respiratory: Scattered bilateral rales.  But there is no wheeze. Abdomen: Soft/+BS, non tender, non distended, no guarding Extremities: No edema,  No lymphangitis, No petechiae, No rashes, no synovitis   Data Reviewed: I have personally reviewed following labs and imaging studies Basic Metabolic Panel: Recent Labs  Lab 12/25/23 1853 12/25/23 1903 12/26/23 0412  NA 144 145 142  K 3.6 3.6 4.4  CL 108 107 105  CO2 23  --  24  GLUCOSE 98 99 125*  BUN 12 12 14   CREATININE 0.97 1.20 0.91  CALCIUM 8.4*  --  8.5*  MG 2.4  --  2.3  PHOS  --   --  3.4   Liver Function Tests: Recent Labs  Lab  12/25/23 1853  AST 22  ALT 10  ALKPHOS 112  BILITOT 0.3  PROT 6.7  ALBUMIN 4.0   No results for input(s): LIPASE, AMYLASE in the last 168 hours. No results for input(s): AMMONIA in the last 168 hours. Coagulation Profile: No results for input(s): INR, PROTIME in the last 168 hours. CBC: Recent Labs  Lab 12/25/23 1853 12/25/23 1903 12/26/23 0412  WBC 6.4  --  4.9  NEUTROABS 3.4  --  4.6  HGB 12.1* 12.6* 12.2*  HCT 38.4* 37.0* 39.1  MCV 85.0  --  86.1  PLT 132*  --  135*   Cardiac Enzymes: No results for input(s): CKTOTAL, CKMB, CKMBINDEX, TROPONINI in the last 168 hours. BNP: Invalid input(s): POCBNP CBG: No results for input(s): GLUCAP in the last 168 hours. HbA1C: No results for input(s): HGBA1C in the last 72 hours. Urine analysis:    Component Value Date/Time   COLORURINE YELLOW 03/21/2022 1930   APPEARANCEUR CLOUDY (A) 03/21/2022 1930   LABSPEC 1.027 03/21/2022 1930   PHURINE 5.0 03/21/2022 1930   GLUCOSEU NEGATIVE 03/21/2022 1930   HGBUR SMALL (A) 03/21/2022 1930   BILIRUBINUR NEGATIVE 03/21/2022 1930   KETONESUR 20 (A) 03/21/2022 1930   PROTEINUR 100 (A) 03/21/2022 1930   NITRITE NEGATIVE 03/21/2022 1930   LEUKOCYTESUR LARGE (A) 03/21/2022 1930   Sepsis Labs: @LABRCNTIP (procalcitonin:4,lacticidven:4) ) Recent Results (from the past 240 hours)  Resp panel by RT-PCR (RSV, Flu A&B, Covid) Anterior Nasal Swab     Status: None   Collection Time: 12/25/23  6:57 PM   Specimen: Anterior Nasal Swab  Result Value Ref Range Status   SARS Coronavirus 2 by RT PCR NEGATIVE NEGATIVE Final    Comment: (NOTE) SARS-CoV-2 target nucleic acids are NOT DETECTED.  The SARS-CoV-2 RNA is generally detectable in upper respiratory specimens during the acute phase of infection. The lowest concentration of SARS-CoV-2 viral copies this assay can detect is 138 copies/mL. A negative result does not preclude SARS-Cov-2 infection and should not be used as  the sole basis for treatment or other patient management decisions. A negative result may occur with  improper specimen collection/handling, submission of specimen other than nasopharyngeal swab, presence of viral mutation(s) within the areas targeted by this assay, and inadequate number of viral copies(<138 copies/mL). A negative result must be combined with clinical observations, patient history, and epidemiological information. The expected result is Negative.  Fact Sheet for Patients:  bloggercourse.com  Fact Sheet for Healthcare Providers:  seriousbroker.it  This test is no t yet approved or cleared by the [place]  FDA and  has been authorized for detection and/or diagnosis of SARS-CoV-2 by FDA under an Emergency Use Authorization (EUA). This EUA will remain  in effect (meaning this test can be used) for the duration of the COVID-19 declaration under Section 564(b)(1) of the Act, 21 U.S.C.section 360bbb-3(b)(1), unless the authorization is terminated  or  revoked sooner.       Influenza A by PCR NEGATIVE NEGATIVE Final   Influenza B by PCR NEGATIVE NEGATIVE Final    Comment: (NOTE) The Xpert Xpress SARS-CoV-2/FLU/RSV plus assay is intended as an aid in the diagnosis of influenza from Nasopharyngeal swab specimens and should not be used as a sole basis for treatment. Nasal washings and aspirates are unacceptable for Xpert Xpress SARS-CoV-2/FLU/RSV testing.  Fact Sheet for Patients: bloggercourse.com  Fact Sheet for Healthcare Providers: seriousbroker.it  This test is not yet approved or cleared by the [place]  FDA and has been authorized for detection and/or diagnosis of SARS-CoV-2 by FDA under an Emergency Use Authorization (EUA). This EUA will remain in effect (meaning this test can be used) for the duration of the COVID-19 declaration under Section 564(b)(1) of  the Act, 21 U.S.C. section 360bbb-3(b)(1), unless the authorization is terminated or revoked.     Resp Syncytial Virus by PCR NEGATIVE NEGATIVE Final    Comment: (NOTE) Fact Sheet for Patients: bloggercourse.com  Fact Sheet for Healthcare Providers: seriousbroker.it  This test is not yet approved or cleared by the [place]  FDA and has been authorized for detection and/or diagnosis of SARS-CoV-2 by FDA under an Emergency Use Authorization (EUA). This EUA will remain in effect (meaning this test can be used) for the duration of the COVID-19 declaration under Section 564(b)(1) of the Act, 21 U.S.C. section 360bbb-3(b)(1), unless the authorization is terminated or revoked.  Performed at [facility], [address], [place], [place] [address]      Scheduled Meds:  [START ON 12/28/2023] albuterol   2.5 mg Nebulization Q6H   arformoterol   15 mcg Nebulization BID   budesonide  (PULMICORT ) nebulizer solution  0.5 mg Nebulization BID   enoxaparin  (LOVENOX ) injection  40 mg Subcutaneous Q24H   guaiFENesin   600 mg Oral BID   ipratropium-albuterol   3 mL Nebulization Q6H   lidocaine   1 patch Transdermal Q24H   methylPREDNISolone  (SOLU-MEDROL ) injection  40 mg Intravenous Q12H   pantoprazole   40 mg Oral QPM   [START ON 12/28/2023] revefenacin  175 mcg Nebulization Daily   Continuous Infusions:  azithromycin  500 mg (12/27/23 1349)   cefTRIAXone  (ROCEPHIN )  IV 2 g (12/27/23 1307)    Procedures/Studies: CT T-SPINE NO CHARGE Result Date: 12/25/2023 EXAM: CT THORACIC SPINE WITH CONTRAST 12/25/2023 08:39:16 PM TECHNIQUE: CT of the thoracic spine was performed with the administration of 75 mL of iohexol  (OMNIPAQUE ) 350 MG/ML injection. Multiplanar reformatted images are provided for review. Automated exposure control, iterative reconstruction, and/or weight based adjustment of the mA/kV was utilized to reduce the radiation dose to as low as  reasonably achievable. COMPARISON: None available. CLINICAL HISTORY: FINDINGS: BONES AND ALIGNMENT: Diffusely decreased bone density. Normal vertebral body heights. No acute fracture or suspicious bone lesion. Normal alignment. DEGENERATIVE CHANGES: No significant degenerative changes. SOFT TISSUES: No acute abnormality. IMPRESSION: 1. No acute abnormality of the thoracic spine. Electronically signed by: Morgane Naveau MD 12/25/2023 08:51 PM EST RP Workstation: [id number]   CT Angio Chest PE W and/or Wo Contrast Result Date: 12/25/2023 EXAM: CTA of the Chest with contrast for PE 12/25/2023 08:39:16 PM TECHNIQUE: CTA of the chest was performed after the administration of 75 mL of iohexol  (OMNIPAQUE ) 350 MG/ML injection. Multiplanar reformatted images are provided for review. MIP images are provided for review. Automated exposure control, iterative reconstruction, and/or weight based adjustment of the mA/kV was utilized to reduce the radiation dose to as low as reasonably achievable. COMPARISON: None available. CLINICAL HISTORY:  Pulmonary embolism (PE) suspected, high prob. FINDINGS: PULMONARY ARTERIES: Pulmonary arteries are adequately opacified for evaluation. No pulmonary embolism. Main pulmonary artery is normal in caliber. MEDIASTINUM: The heart and pericardium demonstrate no acute abnormality. There is no acute abnormality of the thoracic aorta. LYMPH NODES: No mediastinal, hilar or axillary lymphadenopathy. LUNGS AND PLEURA: Moderate centrilobular emphysematous changes. Peribronchial wall thickening. Bilateral mucous plugging. Right lower lobe 6 x 5 mm pulmonary nodule. No focal consolidation or pulmonary edema. No pleural effusion or pneumothorax. UPPER ABDOMEN: Limited images of the upper abdomen are unremarkable. SOFT TISSUES AND BONES: No acute bone or soft tissue abnormality. IMPRESSION: 1. No pulmonary embolism. 2. Peribronchial wall thickening and bibasilar mucus plugging. 3. Right lower lobe solid  pulmonary nodule measuring 6 x 5 mm; recommend non-contrast chest CT at 612 months, then consider additional non-contrast chest CT at 1824 months per Fleischner Society Guidelines, as patient risk is unspecified. 4. Emphysema; consider evaluation for low-dose CT lung cancer screening program, given independent lung cancer risk in eligible patients. Electronically signed by: Morgane Naveau MD 12/25/2023 08:49 PM EST RP Workstation: [id number]   DG Chest Port 1 View Result Date: 12/25/2023 EXAM: 1 VIEW(S) XRAY OF THE CHEST 12/25/2023 06:50:10 PM COMPARISON: 09/28/2023 CLINICAL HISTORY: dyspnea FINDINGS: LUNGS AND PLEURA: Nodular opacity in right mid lung, likely nipple shadow. Chronic coarsened interstitial markings with hyperinflation of the lungs. No pleural effusion. No pneumothorax. HEART AND MEDIASTINUM: Aortic atherosclerosis. No acute abnormality of the cardiac and mediastinal silhouettes. BONES AND SOFT TISSUES: No acute osseous abnormality. IMPRESSION: 1. No acute cardiopulmonary process identified. 2. Nodular right mid-lung opacity is favored to represent a nipple shadow; recommend repeat radiograph with nipple markers to confirm. Electronically signed by: Morgane Naveau MD 12/25/2023 07:07 PM EST RP Workstation: [id number]    Alm Schneider, DO  [facility]  If 7PM-7AM, please contact night-coverage [email or web address] Password [email or web address] 12/27/2023, 6:23 PM   LOS: 1 day

## 2023-12-27 NOTE — Plan of Care (Signed)
°  Problem: Education: Goal: Knowledge of General Education information will improve Description: Including pain rating scale, medication(s)/side effects and non-pharmacologic comfort measures Outcome: Progressing   Problem: Health Behavior/Discharge Planning: Goal: Ability to manage health-related needs will improve Outcome: Progressing   Problem: Clinical Measurements: Goal: Ability to maintain clinical measurements within normal limits will improve Outcome: Progressing Goal: Respiratory complications will improve Outcome: Progressing   Problem: Activity: Goal: Risk for activity intolerance will decrease Outcome: Progressing   Problem: Nutrition: Goal: Adequate nutrition will be maintained Outcome: Progressing   Problem: Coping: Goal: Level of anxiety will decrease Outcome: Progressing   Problem: Pain Managment: Goal: General experience of comfort will improve and/or be controlled Outcome: Progressing   Problem: Safety: Goal: Ability to remain free from injury will improve Outcome: Progressing

## 2023-12-28 NOTE — Progress Notes (Signed)
 PROGRESS NOTE  Jonathan Atkinson FMW:[id number] DOB: [birth year] DOA: 12/25/2023 PCP: Jonathan Leni Edyth [name], MD  Brief History:  73 y.o. male with medical history significant for COPD, former smoker, relapsed last week after quitting tobacco use for about 3 months.  Endorses increased shortness of breath, cough, and sputum production.  Denies any subjective fevers, however admits to chills.  Symptoms started on Friday after he tripped and fell while helping out moving objects outside the [employer] he attends.  He presents to the ER today for further evaluation.   In the ER, diffuse wheezing noted on exam, tachypneic, and hypoxic with ambulation.  O2 saturation of 83% on room air while walking.  Chest x-ray revealed no acute cardiopulmonary process identified.   Due to concern for acute exacerbation of COPD and possible early community-acquired pneumonia, the patient was started on IV azithromycin , Rocephin , IV Solu-Medrol , and bronchodilator nebulizers.  Admitted by [facility], hospitalist service.   ED Course: Temperature 98.6.  BP 146/83, pulse 62, respiratory 18, O2 saturation 95% on 4 L.     Assessment/Plan: Acute exacerbation of COPD  Former smoker, relapsed last week after quitting smoking for 3 months. Declined nicotine patch. -tobacco cessation discussed -continue IV solumedrol -added brovana  -added pulmicort  -started yupelri   Acute hypoxic respiratory failure with hypercarbia Not on oxygen supplementation at baseline O2 saturation in the 80s on room air. 12/8 CTA chest neg for PE; peribronchial wall thickening and mucus plugging -now stable on 3L -wean oxygen to RA   Tobacco user Recently relapsed continue tobacco cessation.  -Declined nicotine patch.      Back pain -- asking for dilaudid  specifically -- explained to patient and RN that dilaudid  is not where we start treatment -- recommended acetaminophen , toradol , lidocaine  patch, heating pad  -- requested a urine  toxicology to screen for recreational substances - UDS positive for cocaine   Cocaine use -cessation discussed   Lung nodule -RLL 6x5 mm nodule noted on CTA chest -outpatient surveillance       Family Communication:   no Family at bedside   Consultants:  none   Code Status:  FULL    DVT Prophylaxis:   [place] Lovenox           Subjective: Patient states that he is breathing better than yesterday.  He has a nonproductive cough.  Denies any chest pain, nausea, vomit, diarrhea abdominal pain.  There is no dysuria or hematuria.  Objective: Vitals:   12/27/23 2016 12/28/23 0115 12/28/23 0907 12/28/23 1417  BP:    125/73  Pulse:    83  Resp:    (!) 24  Temp:    98.3 F (36.8 C)  TempSrc:    Oral  SpO2: 90% 97% 96% 94%  Weight:      Height:        Intake/Output Summary (Last 24 hours) at 12/28/2023 1814 Last data filed at 12/28/2023 1300 Gross per 24 hour  Intake 840 ml  Output 500 ml  Net 340 ml   Weight change:  Exam:  General:  Pt is alert, follows commands appropriately, not in acute distress HEENT: No icterus, No thrush, No neck mass, [place]/AT Cardiovascular: RRR, S1/S2, no rubs, no gallops Respiratory: Bibasilar rales.  Bibasal wheeze.  Good air movement Abdomen: Soft/+BS, non tender, non distended, no guarding Extremities: No edema, No lymphangitis, No petechiae, No rashes, no synovitis   Data Reviewed: I have personally reviewed following labs and imaging  studies Basic Metabolic Panel: Recent Labs  Lab 12/25/23 1853 12/25/23 1903 12/26/23 0412  NA 144 145 142  K 3.6 3.6 4.4  CL 108 107 105  CO2 23  --  24  GLUCOSE 98 99 125*  BUN 12 12 14   CREATININE 0.97 1.20 0.91  CALCIUM 8.4*  --  8.5*  MG 2.4  --  2.3  PHOS  --   --  3.4   Liver Function Tests: Recent Labs  Lab 12/25/23 1853  AST 22  ALT 10  ALKPHOS 112  BILITOT 0.3  PROT 6.7  ALBUMIN 4.0   No results for input(s): LIPASE, AMYLASE in the last 168 hours. No results for  input(s): AMMONIA in the last 168 hours. Coagulation Profile: No results for input(s): INR, PROTIME in the last 168 hours. CBC: Recent Labs  Lab 12/25/23 1853 12/25/23 1903 12/26/23 0412  WBC 6.4  --  4.9  NEUTROABS 3.4  --  4.6  HGB 12.1* 12.6* 12.2*  HCT 38.4* 37.0* 39.1  MCV 85.0  --  86.1  PLT 132*  --  135*   Cardiac Enzymes: No results for input(s): CKTOTAL, CKMB, CKMBINDEX, TROPONINI in the last 168 hours. BNP: Invalid input(s): POCBNP CBG: No results for input(s): GLUCAP in the last 168 hours. HbA1C: No results for input(s): HGBA1C in the last 72 hours. Urine analysis:    Component Value Date/Time   COLORURINE YELLOW 03/21/2022 1930   APPEARANCEUR CLOUDY (A) 03/21/2022 1930   LABSPEC 1.027 03/21/2022 1930   PHURINE 5.0 03/21/2022 1930   GLUCOSEU NEGATIVE 03/21/2022 1930   HGBUR SMALL (A) 03/21/2022 1930   BILIRUBINUR NEGATIVE 03/21/2022 1930   KETONESUR 20 (A) 03/21/2022 1930   PROTEINUR 100 (A) 03/21/2022 1930   NITRITE NEGATIVE 03/21/2022 1930   LEUKOCYTESUR LARGE (A) 03/21/2022 1930   Sepsis Labs: @LABRCNTIP (procalcitonin:4,lacticidven:4) ) Recent Results (from the past 240 hours)  Resp panel by RT-PCR (RSV, Flu A&B, Covid) Anterior Nasal Swab     Status: None   Collection Time: 12/25/23  6:57 PM   Specimen: Anterior Nasal Swab  Result Value Ref Range Status   SARS Coronavirus 2 by RT PCR NEGATIVE NEGATIVE Final    Comment: (NOTE) SARS-CoV-2 target nucleic acids are NOT DETECTED.  The SARS-CoV-2 RNA is generally detectable in upper respiratory specimens during the acute phase of infection. The lowest concentration of SARS-CoV-2 viral copies this assay can detect is 138 copies/mL. A negative result does not preclude SARS-Cov-2 infection and should not be used as the sole basis for treatment or other patient management decisions. A negative result may occur with  improper specimen collection/handling, submission of specimen  other than nasopharyngeal swab, presence of viral mutation(s) within the areas targeted by this assay, and inadequate number of viral copies(<138 copies/mL). A negative result must be combined with clinical observations, patient history, and epidemiological information. The expected result is Negative.  Fact Sheet for Patients:  bloggercourse.com  Fact Sheet for Healthcare Providers:  seriousbroker.it  This test is no t yet approved or cleared by the [place]  FDA and  has been authorized for detection and/or diagnosis of SARS-CoV-2 by FDA under an Emergency Use Authorization (EUA). This EUA will remain  in effect (meaning this test can be used) for the duration of the COVID-19 declaration under Section 564(b)(1) of the Act, 21 U.S.C.section 360bbb-3(b)(1), unless the authorization is terminated  or revoked sooner.       Influenza A by PCR NEGATIVE NEGATIVE Final   Influenza B by  PCR NEGATIVE NEGATIVE Final    Comment: (NOTE) The Xpert Xpress SARS-CoV-2/FLU/RSV plus assay is intended as an aid in the diagnosis of influenza from Nasopharyngeal swab specimens and should not be used as a sole basis for treatment. Nasal washings and aspirates are unacceptable for Xpert Xpress SARS-CoV-2/FLU/RSV testing.  Fact Sheet for Patients: bloggercourse.com  Fact Sheet for Healthcare Providers: seriousbroker.it  This test is not yet approved or cleared by the [place]  FDA and has been authorized for detection and/or diagnosis of SARS-CoV-2 by FDA under an Emergency Use Authorization (EUA). This EUA will remain in effect (meaning this test can be used) for the duration of the COVID-19 declaration under Section 564(b)(1) of the Act, 21 U.S.C. section 360bbb-3(b)(1), unless the authorization is terminated or revoked.     Resp Syncytial Virus by PCR NEGATIVE NEGATIVE Final     Comment: (NOTE) Fact Sheet for Patients: bloggercourse.com  Fact Sheet for Healthcare Providers: seriousbroker.it  This test is not yet approved or cleared by the [place]  FDA and has been authorized for detection and/or diagnosis of SARS-CoV-2 by FDA under an Emergency Use Authorization (EUA). This EUA will remain in effect (meaning this test can be used) for the duration of the COVID-19 declaration under Section 564(b)(1) of the Act, 21 U.S.C. section 360bbb-3(b)(1), unless the authorization is terminated or revoked.  Performed at [facility], [address] [place], [place] [address]      Scheduled Meds:  albuterol   2.5 mg Nebulization Q6H   arformoterol   15 mcg Nebulization BID   budesonide  (PULMICORT ) nebulizer solution  0.5 mg Nebulization BID   enoxaparin  (LOVENOX ) injection  40 mg Subcutaneous Q24H   guaiFENesin   600 mg Oral BID   lidocaine   1 patch Transdermal Q24H   methylPREDNISolone  (SOLU-MEDROL ) injection  40 mg Intravenous Q12H   pantoprazole   40 mg Oral QPM   revefenacin  175 mcg Nebulization Daily   Continuous Infusions:  azithromycin  500 mg (12/28/23 1508)   cefTRIAXone  (ROCEPHIN )  IV 2 g (12/28/23 1330)    Procedures/Studies: CT T-SPINE NO CHARGE Result Date: 12/25/2023 EXAM: CT THORACIC SPINE WITH CONTRAST 12/25/2023 08:39:16 PM TECHNIQUE: CT of the thoracic spine was performed with the administration of 75 mL of iohexol  (OMNIPAQUE ) 350 MG/ML injection. Multiplanar reformatted images are provided for review. Automated exposure control, iterative reconstruction, and/or weight based adjustment of the mA/kV was utilized to reduce the radiation dose to as low as reasonably achievable. COMPARISON: None available. CLINICAL HISTORY: FINDINGS: BONES AND ALIGNMENT: Diffusely decreased bone density. Normal vertebral body heights. No acute fracture or suspicious bone lesion. Normal alignment. DEGENERATIVE CHANGES: No  significant degenerative changes. SOFT TISSUES: No acute abnormality. IMPRESSION: 1. No acute abnormality of the thoracic spine. Electronically signed by: Morgane Naveau MD 12/25/2023 08:51 PM EST RP Workstation: [id number]   CT Angio Chest PE W and/or Wo Contrast Result Date: 12/25/2023 EXAM: CTA of the Chest with contrast for PE 12/25/2023 08:39:16 PM TECHNIQUE: CTA of the chest was performed after the administration of 75 mL of iohexol  (OMNIPAQUE ) 350 MG/ML injection. Multiplanar reformatted images are provided for review. MIP images are provided for review. Automated exposure control, iterative reconstruction, and/or weight based adjustment of the mA/kV was utilized to reduce the radiation dose to as low as reasonably achievable. COMPARISON: None available. CLINICAL HISTORY: Pulmonary embolism (PE) suspected, high prob. FINDINGS: PULMONARY ARTERIES: Pulmonary arteries are adequately opacified for evaluation. No pulmonary embolism. Main pulmonary artery is normal in caliber. MEDIASTINUM: The heart and pericardium demonstrate no acute  abnormality. There is no acute abnormality of the thoracic aorta. LYMPH NODES: No mediastinal, hilar or axillary lymphadenopathy. LUNGS AND PLEURA: Moderate centrilobular emphysematous changes. Peribronchial wall thickening. Bilateral mucous plugging. Right lower lobe 6 x 5 mm pulmonary nodule. No focal consolidation or pulmonary edema. No pleural effusion or pneumothorax. UPPER ABDOMEN: Limited images of the upper abdomen are unremarkable. SOFT TISSUES AND BONES: No acute bone or soft tissue abnormality. IMPRESSION: 1. No pulmonary embolism. 2. Peribronchial wall thickening and bibasilar mucus plugging. 3. Right lower lobe solid pulmonary nodule measuring 6 x 5 mm; recommend non-contrast chest CT at 612 months, then consider additional non-contrast chest CT at 1824 months per Fleischner Society Guidelines, as patient risk is unspecified. 4. Emphysema; consider evaluation for  low-dose CT lung cancer screening program, given independent lung cancer risk in eligible patients. Electronically signed by: Morgane Naveau MD 12/25/2023 08:49 PM EST RP Workstation: [id number]   DG Chest Port 1 View Result Date: 12/25/2023 EXAM: 1 VIEW(S) XRAY OF THE CHEST 12/25/2023 06:50:10 PM COMPARISON: 09/28/2023 CLINICAL HISTORY: dyspnea FINDINGS: LUNGS AND PLEURA: Nodular opacity in right mid lung, likely nipple shadow. Chronic coarsened interstitial markings with hyperinflation of the lungs. No pleural effusion. No pneumothorax. HEART AND MEDIASTINUM: Aortic atherosclerosis. No acute abnormality of the cardiac and mediastinal silhouettes. BONES AND SOFT TISSUES: No acute osseous abnormality. IMPRESSION: 1. No acute cardiopulmonary process identified. 2. Nodular right mid-lung opacity is favored to represent a nipple shadow; recommend repeat radiograph with nipple markers to confirm. Electronically signed by: Morgane Naveau MD 12/25/2023 07:07 PM EST RP Workstation: [id number]    Alm Schneider, DO  [facility]  If 7PM-7AM, please contact night-coverage [email or web address] Password [email or web address] 12/28/2023, 6:14 PM   LOS: 2 days

## 2023-12-28 NOTE — Plan of Care (Signed)
°  Problem: Education: Goal: Knowledge of General Education information will improve Description: Including pain rating scale, medication(s)/side effects and non-pharmacologic comfort measures Outcome: Progressing   Problem: Clinical Measurements: Goal: Ability to maintain clinical measurements within normal limits will improve Outcome: Progressing Goal: Diagnostic test results will improve Outcome: Progressing Goal: Respiratory complications will improve Outcome: Progressing   Problem: Activity: Goal: Risk for activity intolerance will decrease Outcome: Progressing   Problem: Nutrition: Goal: Adequate nutrition will be maintained Outcome: Progressing   Problem: Coping: Goal: Level of anxiety will decrease Outcome: Progressing   Problem: Pain Managment: Goal: General experience of comfort will improve and/or be controlled Outcome: Progressing   Problem: Safety: Goal: Ability to remain free from injury will improve Outcome: Progressing   Problem: Skin Integrity: Goal: Risk for impaired skin integrity will decrease Outcome: Progressing

## 2023-12-29 MED ORDER — AZITHROMYCIN 500 MG PO TABS: 500.0000 mg | ORAL_TABLET | Freq: Every day | ORAL | 0 refills | Status: DC

## 2023-12-29 MED ORDER — AZITHROMYCIN 250 MG PO TABS: 500.0000 mg | ORAL_TABLET | Freq: Every day | ORAL | Status: DC

## 2023-12-29 MED ORDER — PREDNISONE 10 MG PO TABS: 50.0000 mg | ORAL_TABLET | Freq: Every day | ORAL | 0 refills | Status: DC

## 2023-12-29 MED ORDER — PREDNISONE 20 MG PO TABS: 50.0000 mg | ORAL_TABLET | Freq: Every day | ORAL | Status: DC

## 2023-12-29 NOTE — Plan of Care (Signed)
°  Problem: Education: Goal: Knowledge of General Education information will improve Description: Including pain rating scale, medication(s)/side effects and non-pharmacologic comfort measures 12/29/2023 1111 by Mathews Norleen POUR, RN Outcome: Adequate for Discharge 12/29/2023 [phone] by Mathews Norleen POUR, RN Outcome: Progressing   Problem: Health Behavior/Discharge Planning: Goal: Ability to manage health-related needs will improve 12/29/2023 1111 by Mathews Norleen POUR, RN Outcome: Adequate for Discharge 12/29/2023 9077 by Mathews Norleen POUR, RN Outcome: Progressing   Problem: Clinical Measurements: Goal: Ability to maintain clinical measurements within normal limits will improve 12/29/2023 1111 by Mathews Norleen POUR, RN Outcome: Adequate for Discharge 12/29/2023 9077 by Mathews Norleen POUR, RN Outcome: Progressing Goal: Will remain free from infection 12/29/2023 1111 by Mathews Norleen POUR, RN Outcome: Adequate for Discharge 12/29/2023 [phone] by Mathews Norleen POUR, RN Outcome: Progressing Goal: Diagnostic test results will improve 12/29/2023 1111 by Mathews Norleen POUR, RN Outcome: Adequate for Discharge 12/29/2023 9077 by Mathews Norleen POUR, RN Outcome: Progressing Goal: Respiratory complications will improve 12/29/2023 1111 by Mathews Norleen POUR, RN Outcome: Adequate for Discharge 12/29/2023 9077 by Mathews Norleen POUR, RN Outcome: Progressing Goal: Cardiovascular complication will be avoided 12/29/2023 1111 by Mathews Norleen POUR, RN Outcome: Adequate for Discharge 12/29/2023 9077 by Mathews Norleen POUR, RN Outcome: Progressing   Problem: Activity: Goal: Risk for activity intolerance will decrease 12/29/2023 1111 by Mathews Norleen POUR, RN Outcome: Adequate for Discharge 12/29/2023 [phone] by Mathews Norleen POUR, RN Outcome: Progressing   Problem: Nutrition: Goal: Adequate nutrition will be maintained 12/29/2023 1111 by Mathews Norleen POUR, RN Outcome: Adequate for Discharge 12/29/2023 [phone] by Mathews Norleen POUR, RN Outcome: Progressing   Problem:  Coping: Goal: Level of anxiety will decrease 12/29/2023 1111 by Mathews Norleen POUR, RN Outcome: Adequate for Discharge 12/29/2023 [phone] by Mathews Norleen POUR, RN Outcome: Progressing   Problem: Elimination: Goal: Will not experience complications related to bowel motility 12/29/2023 1111 by Mathews Norleen POUR, RN Outcome: Adequate for Discharge 12/29/2023 9077 by Mathews Norleen POUR, RN Outcome: Progressing Goal: Will not experience complications related to urinary retention 12/29/2023 1111 by Mathews Norleen POUR, RN Outcome: Adequate for Discharge 12/29/2023 9077 by Mathews Norleen POUR, RN Outcome: Progressing   Problem: Pain Managment: Goal: General experience of comfort will improve and/or be controlled 12/29/2023 1111 by Mathews Norleen POUR, RN Outcome: Adequate for Discharge 12/29/2023 9077 by Mathews Norleen POUR, RN Outcome: Progressing   Problem: Safety: Goal: Ability to remain free from injury will improve 12/29/2023 1111 by Mathews Norleen POUR, RN Outcome: Adequate for Discharge 12/29/2023 9077 by Mathews Norleen POUR, RN Outcome: Progressing   Problem: Skin Integrity: Goal: Risk for impaired skin integrity will decrease 12/29/2023 1111 by Mathews Norleen POUR, RN Outcome: Adequate for Discharge 12/29/2023 9077 by Mathews Norleen POUR, RN Outcome: Progressing

## 2023-12-29 NOTE — Progress Notes (Signed)
 Mobility Specialist Progress Note:    12/29/23 0835  Mobility  Activity Ambulated with assistance  Level of Assistance Independent  Assistive Device None  Distance Ambulated (ft) 480 ft  Range of Motion/Exercises Active;All extremities  Activity Response Tolerated well  Mobility Referral Yes  Mobility visit 1 Mobility  Mobility Specialist Start Time (ACUTE ONLY) [id number]  Mobility Specialist Stop Time (ACUTE ONLY) 0855  Mobility Specialist Time Calculation (min) (ACUTE ONLY) 20 min   Pt received sitting EOB, agreeable to mobility. Independently able to stand and ambulate with no AD. Tolerated well, SpO2 95% on RA at rest. SpO2 91-94% on RA during ambulation, returned sitting EOB. All needs met.  Marelin Tat Mobility Specialist Please contact via Special Educational Needs Teacher or  Rehab office at [phone]

## 2023-12-29 NOTE — Plan of Care (Signed)

## 2023-12-29 NOTE — Discharge Summary (Signed)
 Physician Discharge Summary   Patient: Jonathan Atkinson MRN: [id number] DOB: 10-Feb-1950  Admit date:     12/25/2023  Discharge date: 12/29/2023  Discharge Physician: Alm Vernette Moise   PCP: Maree Leni Edyth [name], MD   Recommendations at discharge:   Please follow up with primary care provider within 1-2 weeks  Please repeat BMP and CBC in one week   Hospital Course: 73 y.o. male with medical history significant for COPD, former smoker, relapsed last week after quitting tobacco use for about 3 months.  Endorses increased shortness of breath, cough, and sputum production.  Denies any subjective fevers, however admits to chills.  Symptoms started on Friday after he tripped and fell while helping out moving objects outside the [employer] he attends.  He presents to the ER today for further evaluation.   In the ER, diffuse wheezing noted on exam, tachypneic, and hypoxic with ambulation.  O2 saturation of 83% on room air while walking.  Chest x-ray revealed no acute cardiopulmonary process identified.   Due to concern for acute exacerbation of COPD and possible early community-acquired pneumonia, the patient was started on IV azithromycin , Rocephin , IV Solu-Medrol , and bronchodilator nebulizers.  Admitted by [facility], hospitalist service.   ED Course: Temperature 98.6.  BP 146/83, pulse 62, respiratory 18, O2 saturation 95% on 4 L.    Assessment and Plan: Acute exacerbation of COPD  Former smoker, relapsed last week after quitting smoking for 3 months. Declined nicotine patch. -tobacco cessation discussed -continue IV solumedrol>>d/c home with prednisone  taper  -d/c home with 2 more days azithromycin  -added brovana  -added pulmicort  -started yupelri   Acute hypoxic respiratory failure with hypercarbia Not on oxygen supplementation at baseline O2 saturation in the 80s on room air. 12/8 CTA chest neg for PE; peribronchial wall thickening and mucus plugging -now stable on 3L -weaned oxygen to  RA -12/12--ambulated hallway without desaturation < 92%   Tobacco user Recently relapsed continue tobacco cessation.  -Declined nicotine patch.      Back pain -- asking for dilaudid  specifically -- explained to patient and RN that dilaudid  is not where we start treatment -- recommended acetaminophen , toradol , lidocaine  patch, heating pad  -- requested a urine toxicology to screen for recreational substances - UDS positive for cocaine - stable without opioids   Cocaine use -cessation discussed   Lung nodule -RLL 6x5 mm nodule noted on CTA chest -outpatient surveillance        Consultants: none Procedures performed: none  Disposition: home Diet recommendation:  Regular diet DISCHARGE MEDICATION: Allergies as of 12/29/2023       Reactions   Penicillins Dermatitis, Rash        Medication List     TAKE these medications    albuterol  108 (90 Base) MCG/ACT inhaler Commonly known as: VENTOLIN  HFA Inhale 2 puffs into the lungs every 4 (four) hours as needed for shortness of breath or wheezing.   azithromycin  500 MG tablet Commonly known as: ZITHROMAX  Take 1 tablet (500 mg total) by mouth daily.   ipratropium-albuterol  0.5-2.5 (3) MG/3ML Soln Commonly known as: DUONEB Take 3 mLs by nebulization every 6 (six) hours as needed. What changed:  when to take this reasons to take this   predniSONE  10 MG tablet Commonly known as: DELTASONE  Take 5 tablets (50 mg total) by mouth daily with breakfast. And decrease by one tablet daily Start taking on: December 30, 2023        Discharge Exam: Fredricka Weights   12/25/23 1833 12/25/23 2310  Weight: 68 kg 73 kg   HEENT:  [place]ouss[place]/AT, No thrush, no icterus CV:  RRR, no rub, no S3, no S4 Lung:  bibasilar rales. No wheeze Abd:  soft/+BS, NT Ext:  No edema, no lymphangitis, no synovitis, no rash   Condition at discharge: stable  The results of significant diagnostics from this hospitalization (including imaging,  microbiology, ancillary and laboratory) are listed below for reference.   Imaging Studies: CT T-SPINE NO CHARGE Result Date: 12/25/2023 EXAM: CT THORACIC SPINE WITH CONTRAST 12/25/2023 08:39:16 PM TECHNIQUE: CT of the thoracic spine was performed with the administration of 75 mL of iohexol  (OMNIPAQUE ) 350 MG/ML injection. Multiplanar reformatted images are provided for review. Automated exposure control, iterative reconstruction, and/or weight based adjustment of the mA/kV was utilized to reduce the radiation dose to as low as reasonably achievable. COMPARISON: None available. CLINICAL HISTORY: FINDINGS: BONES AND ALIGNMENT: Diffusely decreased bone density. Normal vertebral body heights. No acute fracture or suspicious bone lesion. Normal alignment. DEGENERATIVE CHANGES: No significant degenerative changes. SOFT TISSUES: No acute abnormality. IMPRESSION: 1. No acute abnormality of the thoracic spine. Electronically signed by: Morgane Naveau MD 12/25/2023 08:51 PM EST RP Workstation: [id number]   CT Angio Chest PE W and/or Wo Contrast Result Date: 12/25/2023 EXAM: CTA of the Chest with contrast for PE 12/25/2023 08:39:16 PM TECHNIQUE: CTA of the chest was performed after the administration of 75 mL of iohexol  (OMNIPAQUE ) 350 MG/ML injection. Multiplanar reformatted images are provided for review. MIP images are provided for review. Automated exposure control, iterative reconstruction, and/or weight based adjustment of the mA/kV was utilized to reduce the radiation dose to as low as reasonably achievable. COMPARISON: None available. CLINICAL HISTORY: Pulmonary embolism (PE) suspected, high prob. FINDINGS: PULMONARY ARTERIES: Pulmonary arteries are adequately opacified for evaluation. No pulmonary embolism. Main pulmonary artery is normal in caliber. MEDIASTINUM: The heart and pericardium demonstrate no acute abnormality. There is no acute abnormality of the thoracic aorta. LYMPH NODES: No mediastinal, hilar or  axillary lymphadenopathy. LUNGS AND PLEURA: Moderate centrilobular emphysematous changes. Peribronchial wall thickening. Bilateral mucous plugging. Right lower lobe 6 x 5 mm pulmonary nodule. No focal consolidation or pulmonary edema. No pleural effusion or pneumothorax. UPPER ABDOMEN: Limited images of the upper abdomen are unremarkable. SOFT TISSUES AND BONES: No acute bone or soft tissue abnormality. IMPRESSION: 1. No pulmonary embolism. 2. Peribronchial wall thickening and bibasilar mucus plugging. 3. Right lower lobe solid pulmonary nodule measuring 6 x 5 mm; recommend non-contrast chest CT at 612 months, then consider additional non-contrast chest CT at 1824 months per Fleischner Society Guidelines, as patient risk is unspecified. 4. Emphysema; consider evaluation for low-dose CT lung cancer screening program, given independent lung cancer risk in eligible patients. Electronically signed by: Morgane Naveau MD 12/25/2023 08:49 PM EST RP Workstation: [id number]   DG Chest Port 1 View Result Date: 12/25/2023 EXAM: 1 VIEW(S) XRAY OF THE CHEST 12/25/2023 06:50:10 PM COMPARISON: 09/28/2023 CLINICAL HISTORY: dyspnea FINDINGS: LUNGS AND PLEURA: Nodular opacity in right mid lung, likely nipple shadow. Chronic coarsened interstitial markings with hyperinflation of the lungs. No pleural effusion. No pneumothorax. HEART AND MEDIASTINUM: Aortic atherosclerosis. No acute abnormality of the cardiac and mediastinal silhouettes. BONES AND SOFT TISSUES: No acute osseous abnormality. IMPRESSION: 1. No acute cardiopulmonary process identified. 2. Nodular right mid-lung opacity is favored to represent a nipple shadow; recommend repeat radiograph with nipple markers to confirm. Electronically signed by: Morgane Naveau MD 12/25/2023 07:07 PM EST RP Workstation: [id number]    Microbiology: Results for orders placed or  performed during the hospital encounter of 12/25/23  Resp panel by RT-PCR (RSV, Flu A&B, Covid) Anterior  Nasal Swab     Status: None   Collection Time: 12/25/23  6:57 PM   Specimen: Anterior Nasal Swab  Result Value Ref Range Status   SARS Coronavirus 2 by RT PCR NEGATIVE NEGATIVE Final    Comment: (NOTE) SARS-CoV-2 target nucleic acids are NOT DETECTED.  The SARS-CoV-2 RNA is generally detectable in upper respiratory specimens during the acute phase of infection. The lowest concentration of SARS-CoV-2 viral copies this assay can detect is 138 copies/mL. A negative result does not preclude SARS-Cov-2 infection and should not be used as the sole basis for treatment or other patient management decisions. A negative result may occur with  improper specimen collection/handling, submission of specimen other than nasopharyngeal swab, presence of viral mutation(s) within the areas targeted by this assay, and inadequate number of viral copies(<138 copies/mL). A negative result must be combined with clinical observations, patient history, and epidemiological information. The expected result is Negative.  Fact Sheet for Patients:  bloggercourse.com  Fact Sheet for Healthcare Providers:  seriousbroker.it  This test is no t yet approved or cleared by the [place]  FDA and  has been authorized for detection and/or diagnosis of SARS-CoV-2 by FDA under an Emergency Use Authorization (EUA). This EUA will remain  in effect (meaning this test can be used) for the duration of the COVID-19 declaration under Section 564(b)(1) of the Act, 21 U.S.C.section 360bbb-3(b)(1), unless the authorization is terminated  or revoked sooner.       Influenza A by PCR NEGATIVE NEGATIVE Final   Influenza B by PCR NEGATIVE NEGATIVE Final    Comment: (NOTE) The Xpert Xpress SARS-CoV-2/FLU/RSV plus assay is intended as an aid in the diagnosis of influenza from Nasopharyngeal swab specimens and should not be used as a sole basis for treatment. Nasal washings  and aspirates are unacceptable for Xpert Xpress SARS-CoV-2/FLU/RSV testing.  Fact Sheet for Patients: bloggercourse.com  Fact Sheet for Healthcare Providers: seriousbroker.it  This test is not yet approved or cleared by the [place]  FDA and has been authorized for detection and/or diagnosis of SARS-CoV-2 by FDA under an Emergency Use Authorization (EUA). This EUA will remain in effect (meaning this test can be used) for the duration of the COVID-19 declaration under Section 564(b)(1) of the Act, 21 U.S.C. section 360bbb-3(b)(1), unless the authorization is terminated or revoked.     Resp Syncytial Virus by PCR NEGATIVE NEGATIVE Final    Comment: (NOTE) Fact Sheet for Patients: bloggercourse.com  Fact Sheet for Healthcare Providers: seriousbroker.it  This test is not yet approved or cleared by the [place]  FDA and has been authorized for detection and/or diagnosis of SARS-CoV-2 by FDA under an Emergency Use Authorization (EUA). This EUA will remain in effect (meaning this test can be used) for the duration of the COVID-19 declaration under Section 564(b)(1) of the Act, 21 U.S.C. section 360bbb-3(b)(1), unless the authorization is terminated or revoked.  Performed at [facility], [address], [place], [place] [address]     Labs: CBC: Recent Labs  Lab 12/25/23 1853 12/25/23 1903 12/26/23 0412  WBC 6.4  --  4.9  NEUTROABS 3.4  --  4.6  HGB 12.1* 12.6* 12.2*  HCT 38.4* 37.0* 39.1  MCV 85.0  --  86.1  PLT 132*  --  135*   Basic Metabolic Panel: Recent Labs  Lab 12/25/23 1853 12/25/23 1903 12/26/23 0412  NA 144 145 142  K 3.6 3.6 4.4  CL 108 107 105  CO2 23  --  24  GLUCOSE 98 99 125*  BUN 12 12 14   CREATININE 0.97 1.20 0.91  CALCIUM 8.4*  --  8.5*  MG 2.4  --  2.3  PHOS  --   --  3.4   Liver Function Tests: Recent Labs  Lab 12/25/23 1853   AST 22  ALT 10  ALKPHOS 112  BILITOT 0.3  PROT 6.7  ALBUMIN 4.0   CBG: No results for input(s): GLUCAP in the last 168 hours.  Discharge time spent: greater than 30 minutes.  Signed: Alm Schneider, MD [facility] 12/29/2023

## 2023-12-29 NOTE — Plan of Care (Signed)
°  Problem: Education: Goal: Knowledge of General Education information will improve Description: Including pain rating scale, medication(s)/side effects and non-pharmacologic comfort measures Outcome: Progressing   Problem: Health Behavior/Discharge Planning: Goal: Ability to manage health-related needs will improve Outcome: Progressing   Problem: Clinical Measurements: Goal: Ability to maintain clinical measurements within normal limits will improve Outcome: Progressing Goal: Respiratory complications will improve Outcome: Progressing   Problem: Pain Managment: Goal: General experience of comfort will improve and/or be controlled Outcome: Progressing   Problem: Safety: Goal: Ability to remain free from injury will improve Outcome: Progressing

## 2024-02-14 ENCOUNTER — Other Ambulatory Visit: Payer: Self-pay

## 2024-02-14 ENCOUNTER — Encounter (HOSPITAL_COMMUNITY): Payer: Self-pay | Admitting: Emergency Medicine

## 2024-02-14 ENCOUNTER — Emergency Department (HOSPITAL_COMMUNITY)
Admission: EM | Admit: 2024-02-14 | Discharge: 2024-02-14 | Disposition: A | Discharge status: Home / Self Care | Attending: Emergency Medicine | Admitting: Emergency Medicine

## 2024-02-14 ENCOUNTER — Emergency Department (HOSPITAL_COMMUNITY)

## 2024-02-14 DIAGNOSIS — R059 Cough, unspecified: Secondary | ICD-10-CM | POA: Diagnosis present

## 2024-02-14 DIAGNOSIS — J441 Chronic obstructive pulmonary disease with (acute) exacerbation: Secondary | ICD-10-CM | POA: Insufficient documentation

## 2024-02-14 DIAGNOSIS — J210 Acute bronchiolitis due to respiratory syncytial virus: Secondary | ICD-10-CM | POA: Diagnosis not present

## 2024-02-14 LAB — BASIC METABOLIC PANEL WITH GFR
Anion gap: 13 (ref 5–15)
BUN: 15 mg/dL (ref 8–23)
CO2: 23 mmol/L (ref 22–32)
Calcium: 8.7 mg/dL — ABNORMAL LOW (ref 8.9–10.3)
Chloride: 109 mmol/L (ref 98–111)
Creatinine, Ser: 1 mg/dL (ref 0.61–1.24)
GFR, Estimated: >60 mL/min
Glucose, Bld: 79 mg/dL (ref 70–99)
Potassium: 3.7 mmol/L (ref 3.5–5.1)
Sodium: 145 mmol/L (ref 135–145)

## 2024-02-14 LAB — CBC WITH DIFFERENTIAL/PLATELET
Abs Immature Granulocytes: 0.01 10*3/uL (ref 0.00–0.07)
Basophils Absolute: 0 10*3/uL (ref 0.0–0.1)
Basophils Relative: 1 %
Eosinophils Absolute: 0.4 10*3/uL (ref 0.0–0.5)
Eosinophils Relative: 8 %
HCT: 41 % (ref 39.0–52.0)
Hemoglobin: 12.7 g/dL — ABNORMAL LOW (ref 13.0–17.0)
Immature Granulocytes: 0 %
Lymphocytes Relative: 35 %
Lymphs Abs: 1.8 10*3/uL (ref 0.7–4.0)
MCH: 26.2 pg (ref 26.0–34.0)
MCHC: 31 g/dL (ref 30.0–36.0)
MCV: 84.7 fL (ref 80.0–100.0)
Monocytes Absolute: 1 10*3/uL (ref 0.1–1.0)
Monocytes Relative: 20 %
Neutro Abs: 1.8 10*3/uL (ref 1.7–7.7)
Neutrophils Relative %: 36 %
Platelets: 129 10*3/uL — ABNORMAL LOW (ref 150–400)
RBC: 4.84 MIL/uL (ref 4.22–5.81)
RDW: 15.9 % — ABNORMAL HIGH (ref 11.5–15.5)
WBC: 5 10*3/uL (ref 4.0–10.5)
nRBC: 0 % (ref 0.0–0.2)

## 2024-02-14 LAB — RESP PANEL BY RT-PCR (RSV, FLU A&B, COVID)  RVPGX2
Influenza A by PCR: NEGATIVE
Influenza B by PCR: NEGATIVE
Resp Syncytial Virus by PCR: POSITIVE — AB
SARS Coronavirus 2 by RT PCR: NEGATIVE

## 2024-02-14 MED ORDER — IPRATROPIUM BROMIDE 0.02 % IN SOLN
0.5000 mg | Freq: Once | RESPIRATORY_TRACT | Status: AC
  Administered 2024-02-14: 0.5 mg via RESPIRATORY_TRACT
  Filled 2024-02-14: qty 2.5

## 2024-02-14 MED ORDER — CETIRIZINE HCL 10 MG PO TABS: 10.0000 mg | ORAL_TABLET | Freq: Two times a day (BID) | ORAL | 0 refills | Status: AC

## 2024-02-14 MED ORDER — AZITHROMYCIN 250 MG PO TABS: 250.0000 mg | ORAL_TABLET | Freq: Every day | ORAL | 0 refills | Status: AC

## 2024-02-14 MED ORDER — PREDNISONE 10 MG (21) PO TBPK: ORAL_TABLET | Freq: Every day | ORAL | 0 refills | Status: AC

## 2024-02-14 MED ORDER — METHYLPREDNISOLONE SODIUM SUCC 125 MG IJ SOLR: 125.0000 mg | Freq: Once | INTRAMUSCULAR | Status: DC

## 2024-02-14 MED ORDER — ALBUTEROL SULFATE (2.5 MG/3ML) 0.083% IN NEBU
INHALATION_SOLUTION | RESPIRATORY_TRACT | Status: AC
  Administered 2024-02-14: 10 mg
  Filled 2024-02-14: qty 12

## 2024-02-14 MED ORDER — ALBUTEROL SULFATE (2.5 MG/3ML) 0.083% IN NEBU: 10.0000 mg/h | INHALATION_SOLUTION | Freq: Once | RESPIRATORY_TRACT | Status: DC

## 2024-02-14 NOTE — ED Notes (Signed)
 Respiratory at bedside.

## 2024-02-14 NOTE — Discharge Instructions (Addendum)
 We saw you in the ER for your breathing related complains. We gave you some breathing treatments in the ER, and seems like your symptoms have improved.  As discussed, you have a viral infection, that is causing your breathing to get worse.  This infection is typically self-limiting and the treatment is supportive.  Make sure you are drinking lots of water, inhaler can be used every 2-4 hours as needed for shortness of breath.  Take the other medications that are prescribed for symptom management.  Please refrain from smoking or smoke exposure. Please see a primary care doctor in 1 week. Return to the ER if your symptoms worsen.

## 2024-02-14 NOTE — ED Provider Notes (Signed)
 " [place] [facility]GENCY DEPARTMENT AT [facility] Provider Note   CSN: [id number] Arrival date & time: 02/14/24  9093     Patient presents with: Shortness of Breath   Jonathan Atkinson is a 74 y.o. male.   HPI     74 y.o. male with medical history significant for COPD comes in with chief complaint of increased shortness of breath, cough, and sputum production.  Patient reports that he has been feeling short of breath for the last 2 to 3 days.  His symptoms have worsened over time, today he was having difficulty breathing and called 911.  Patient reports significant congestion with green phlegm.  EMS has given patient 2 rounds of breathing treatment and Solu-Medrol  prior to ED arrival.  Prior to Admission medications  Medication Sig Start Date End Date Taking? Authorizing Provider  azithromycin  (ZITHROMAX ) 250 MG tablet Take 1 tablet (250 mg total) by mouth daily. Take first 2 tablets together, then 1 every day until finished. 02/14/24  Yes Charlyn Sora, MD  cetirizine  (ZYRTEC  ALLERGY) 10 MG tablet Take 1 tablet (10 mg total) by mouth 2 (two) times daily. 02/14/24  Yes Heston Widener, MD  predniSONE  (STERAPRED UNI-PAK 21 TAB) 10 MG (21) TBPK tablet Take by mouth daily. Take 6 tabs by mouth daily  for 2 days, then 5 tabs for 2 days, then 4 tabs for 2 days, then 3 tabs for 2 days, 2 tabs for 2 days, then 1 tab by mouth daily for 2 days 02/14/24  Yes Charlyn Sora, MD  albuterol  (VENTOLIN  HFA) 108 (90 Base) MCG/ACT inhaler Inhale 2 puffs into the lungs every 4 (four) hours as needed for shortness of breath or wheezing. 06/14/19   [provider]  ipratropium-albuterol  (DUONEB) 0.5-2.5 (3) MG/3ML SOLN Take 3 mLs by nebulization every 6 (six) hours as needed. Patient taking differently: Take 3 mLs by nebulization every 4 (four) hours as needed (wheezing, shortness of breath). 10/09/22   Small, Brooke L, PA    Allergies: Penicillins    Review of Systems  All other systems  reviewed and are negative.   Updated Vital Signs BP (!) 142/87   Pulse (!) 101   Resp (!) 25   Ht 5' 11 (1.803 m)   Wt 73 kg   SpO2 93%   BMI 22.45 kg/m   Physical Exam Vitals and nursing note reviewed.  Constitutional:      Appearance: He is well-developed.  HENT:     Head: Atraumatic.  Cardiovascular:     Rate and Rhythm: Normal rate.  Pulmonary:     Effort: Pulmonary effort is normal. Tachypnea present. No respiratory distress.     Breath sounds: Wheezing present.  Musculoskeletal:     Cervical back: Neck supple.  Skin:    General: Skin is warm.  Neurological:     Mental Status: He is alert and oriented to person, place, and time.     (all labs ordered are listed, but only abnormal results are displayed) Labs Reviewed  RESP PANEL BY RT-PCR (RSV, FLU A&B, COVID)  [id number] - Abnormal; Notable for the following components:      Result Value   Resp Syncytial Virus by PCR POSITIVE (*)    All other components within normal limits  BASIC METABOLIC PANEL WITH GFR - Abnormal; Notable for the following components:   Calcium 8.7 (*)    All other components within normal limits  CBC WITH DIFFERENTIAL/PLATELET - Abnormal; Notable for the following components:  Hemoglobin 12.7 (*)    RDW 15.9 (*)    Platelets 129 (*)    All other components within normal limits    EKG: EKG Interpretation Date/Time:  Wednesday February 14 2024 09:15:36 EST Ventricular Rate:  101 PR Interval:  137 QRS Duration:  85 QT Interval:  345 QTC Calculation: 448 R Axis:   76  Text Interpretation: Sinus tachycardia Nonspecific T abnormalities, lateral leads No acute changes No significant change since last tracing Confirmed by Charlyn Sora ([id number]) on 02/14/2024 10:22:04 AM  Radiology: [facility] Chest Port 1 View Result Date: 02/14/2024 CLINICAL DATA:  Shortness of breath.  COPD exacerbation. EXAM: PORTABLE CHEST 1 VIEW COMPARISON:  Chest CT 12/25/2023. Radiographs 12/25/2023 and 09/28/2023.  FINDINGS: 0936 hours. The heart size and mediastinal contours are normal. The lungs are clear. There is no pleural effusion or pneumothorax. No acute osseous findings are identified. Telemetry leads and a necklace overlie the patient. IMPRESSION: No evidence of active cardiopulmonary process. Electronically Signed   By: Elsie Perone M.D.   On: 02/14/2024 10:02     Procedures   Medications Ordered in the ED  albuterol  (PROVENTIL ) (2.5 MG/3ML) 0.083% nebulizer solution (10 mg/hr Nebulization Not Given 02/14/24 0936)  ipratropium (ATROVENT ) nebulizer solution 0.5 mg (0.5 mg Nebulization Given 02/14/24 0920)  albuterol  (PROVENTIL ) (2.5 MG/3ML) 0.083% nebulizer solution (10 mg  Given 02/14/24 0923)    Clinical Course as of 02/14/24 1057  Wed Feb 14, 2024  1028 Resp panel by RT-PCR (RSV, Flu A&B, Covid) Anterior Nasal Swab(!) RSV positive.  CBC is normal. [AN]  1053 Patient feels a lot better, requesting discharge.  Shared with him that he has RSV.  On my repeat exam, the wheezing has improved substantially.  Patient's O2 sats at 95% on room air.  I shared with him that RSV is typically a self-limiting viral illness, but in patients with COPD, there is high risk for decompensation.  Patient states that at this time he is comfortable leaving, but he will return to the ER if his symptoms get worse. [AN]  1054 We will prescribe azithromycin  and prednisone  for appears to be COPD exacerbation with new phlegm and yellow/green sputum. [AN]    Clinical Course User Index [AN] Charlyn Sora, MD                                 Medical Decision Making Amount and/or Complexity of Data Reviewed Labs: ordered. Decision-making details documented in ED Course. Radiology: ordered.  Risk OTC drugs. Prescription drug management.   This patient presents to the ED with chief complaint(s) of shortness of breath, wheezing with pertinent past medical history of COPD. The complaint involves an extensive  differential diagnosis and also carries with it a high risk of complications and morbidity.    Additional history obtained from EMS . I have also reviewed previous admission documents  The differential diagnosis includes : COPD exacerbation, pneumonia, CHF, pleural effusion, pulmonary edema, viral illness  The initial management includes getting basic labs and giving patient continuous nebulizer treatment.  Patient tachypneic, but not in respiratory distress.  I do not think he needs to be placed on BiPAP right now.  Reassessments:  The following labs were independently interpreted: RSV positive.  CBC is normal.  I independently visualized the following imaging with scope of interpretation limited to determining acute life threatening conditions related to emergency care: X-ray of the chest, which revealed no focal consolidation.  Treatment and Reassessment: See above. Patient feels better.  He wants to go home.  He has received hour-long breathing treatment in the ER.  The patient appears reasonably screened and/or stabilized for discharge and I doubt any other medical condition or other [facility] requiring further screening, evaluation, or treatment in the ED at this time prior to discharge.   Results from the ER workup discussed with the patient face to face and all questions answered to the best of my ability. The patient is safe for discharge with strict return precautions.    Final diagnoses:  COPD exacerbation (HCC)  RSV bronchiolitis    ED Discharge Orders          Ordered    predniSONE  (STERAPRED UNI-PAK 21 TAB) 10 MG (21) TBPK tablet  Daily        02/14/24 1055    azithromycin  (ZITHROMAX ) 250 MG tablet  Daily        02/14/24 1055    cetirizine  (ZYRTEC  ALLERGY) 10 MG tablet  2 times daily        02/14/24 1056               Charlyn Sora, MD 02/14/24 1058  "

## 2024-02-14 NOTE — ED Triage Notes (Signed)
 Pt bib [facility] from home for COPD exacerbation. Pt states he has been feeling sob x last 2 days but it got worse this morning. Pt took 1 breathing tx at home then EMS gave 5mg  albuterol ,0.5 atrovent  & 125 solumedrol. Pt has expiratory wheezing.
